# Patient Record
Sex: Male | Born: 1970 | Race: White | Hispanic: No | Smoking: Current every day smoker
Health system: Southern US, Community
[De-identification: ages and names within clinical notes are randomized; demographics above are authoritative.]

## PROBLEM LIST (undated history)

## (undated) DIAGNOSIS — G510 Bell's palsy: Secondary | ICD-10-CM

## (undated) DIAGNOSIS — N200 Calculus of kidney: Secondary | ICD-10-CM

## (undated) HISTORY — PX: ANKLE RECONSTRUCTION: SHX1151

---

## 2016-04-06 ENCOUNTER — Encounter (HOSPITAL_COMMUNITY): Payer: Self-pay | Admitting: Emergency Medicine

## 2016-04-06 ENCOUNTER — Emergency Department (HOSPITAL_COMMUNITY): Payer: Self-pay

## 2016-04-06 ENCOUNTER — Inpatient Hospital Stay (HOSPITAL_COMMUNITY)
Admission: EM | Admit: 2016-04-06 | Discharge: 2016-04-11 | DRG: 917 | Disposition: A | Discharge status: Home / Self Care | Payer: Self-pay | Attending: Internal Medicine | Admitting: Internal Medicine

## 2016-04-06 DIAGNOSIS — R4182 Altered mental status, unspecified: Secondary | ICD-10-CM

## 2016-04-06 DIAGNOSIS — R569 Unspecified convulsions: Secondary | ICD-10-CM | POA: Diagnosis present

## 2016-04-06 DIAGNOSIS — T50901A Poisoning by unspecified drugs, medicaments and biological substances, accidental (unintentional), initial encounter: Principal | ICD-10-CM | POA: Diagnosis present

## 2016-04-06 DIAGNOSIS — F17200 Nicotine dependence, unspecified, uncomplicated: Secondary | ICD-10-CM | POA: Diagnosis present

## 2016-04-06 DIAGNOSIS — B029 Zoster without complications: Secondary | ICD-10-CM | POA: Diagnosis present

## 2016-04-06 DIAGNOSIS — T68XXXA Hypothermia, initial encounter: Secondary | ICD-10-CM

## 2016-04-06 DIAGNOSIS — G9340 Encephalopathy, unspecified: Secondary | ICD-10-CM | POA: Diagnosis present

## 2016-04-06 DIAGNOSIS — R0681 Apnea, not elsewhere classified: Secondary | ICD-10-CM

## 2016-04-06 DIAGNOSIS — R739 Hyperglycemia, unspecified: Secondary | ICD-10-CM | POA: Diagnosis present

## 2016-04-06 DIAGNOSIS — Z87442 Personal history of urinary calculi: Secondary | ICD-10-CM

## 2016-04-06 DIAGNOSIS — N200 Calculus of kidney: Secondary | ICD-10-CM

## 2016-04-06 DIAGNOSIS — J9691 Respiratory failure, unspecified with hypoxia: Secondary | ICD-10-CM | POA: Diagnosis present

## 2016-04-06 DIAGNOSIS — N1330 Unspecified hydronephrosis: Secondary | ICD-10-CM | POA: Diagnosis present

## 2016-04-06 DIAGNOSIS — R319 Hematuria, unspecified: Secondary | ICD-10-CM

## 2016-04-06 DIAGNOSIS — E876 Hypokalemia: Secondary | ICD-10-CM | POA: Diagnosis present

## 2016-04-06 DIAGNOSIS — E872 Acidosis: Secondary | ICD-10-CM | POA: Diagnosis present

## 2016-04-06 DIAGNOSIS — I959 Hypotension, unspecified: Secondary | ICD-10-CM | POA: Diagnosis present

## 2016-04-06 DIAGNOSIS — R680 Hypothermia, not associated with low environmental temperature: Secondary | ICD-10-CM | POA: Diagnosis present

## 2016-04-06 DIAGNOSIS — N390 Urinary tract infection, site not specified: Secondary | ICD-10-CM

## 2016-04-06 DIAGNOSIS — F1010 Alcohol abuse, uncomplicated: Secondary | ICD-10-CM | POA: Diagnosis present

## 2016-04-06 DIAGNOSIS — A419 Sepsis, unspecified organism: Secondary | ICD-10-CM

## 2016-04-06 HISTORY — DX: Calculus of kidney: N20.0

## 2016-04-06 HISTORY — DX: Bell's palsy: G51.0

## 2016-04-06 LAB — CBC WITH DIFFERENTIAL/PLATELET
Basophils Absolute: 0 10*3/uL (ref 0.0–0.1)
Basophils Relative: 0 %
EOS ABS: 0.1 10*3/uL (ref 0.0–0.7)
Eosinophils Relative: 1 %
HEMATOCRIT: 41.8 % (ref 39.0–52.0)
HEMOGLOBIN: 14.3 g/dL (ref 13.0–17.0)
LYMPHS ABS: 1.8 10*3/uL (ref 0.7–4.0)
LYMPHS PCT: 20 %
MCH: 31.2 pg (ref 26.0–34.0)
MCHC: 34.2 g/dL (ref 30.0–36.0)
MCV: 91.3 fL (ref 78.0–100.0)
Monocytes Absolute: 0.8 10*3/uL (ref 0.1–1.0)
Monocytes Relative: 9 %
NEUTROS ABS: 6 10*3/uL (ref 1.7–7.7)
NEUTROS PCT: 70 %
Platelets: 193 10*3/uL (ref 150–400)
RBC: 4.58 MIL/uL (ref 4.22–5.81)
RDW: 14.3 % (ref 11.5–15.5)
WBC: 8.7 10*3/uL (ref 4.0–10.5)

## 2016-04-06 MED ORDER — SODIUM CHLORIDE 0.9 % IV SOLN
10.0000 ug/h | INTRAVENOUS | Status: DC
  Administered 2016-04-06: 10 ug/h via INTRAVENOUS

## 2016-04-06 MED ORDER — MIDAZOLAM 50MG/50ML (1MG/ML) PREMIX INFUSION
INTRAVENOUS | Status: AC
  Filled 2016-04-06: qty 50

## 2016-04-06 MED ORDER — FENTANYL 2500MCG IN NS 250ML (10MCG/ML) PREMIX INFUSION
INTRAVENOUS | Status: AC
  Filled 2016-04-06: qty 250

## 2016-04-06 MED ORDER — ETOMIDATE 2 MG/ML IV SOLN
20.0000 mg | Freq: Once | INTRAVENOUS | Status: AC
  Administered 2016-04-06: 20 mg via INTRAVENOUS

## 2016-04-06 MED ORDER — SODIUM CHLORIDE 0.9 % IV SOLN
1.0000 mg/h | INTRAVENOUS | Status: DC
  Administered 2016-04-06 – 2016-04-08 (×2): 1 mg/h via INTRAVENOUS

## 2016-04-06 MED ORDER — SUCCINYLCHOLINE CHLORIDE 20 MG/ML IJ SOLN
150.0000 mg | Freq: Once | INTRAMUSCULAR | Status: AC
  Administered 2016-04-06: 150 mg via INTRAVENOUS
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
## 2016-04-07 ENCOUNTER — Encounter (HOSPITAL_COMMUNITY): Payer: Self-pay | Admitting: Internal Medicine

## 2016-04-07 ENCOUNTER — Inpatient Hospital Stay (HOSPITAL_COMMUNITY): Payer: Self-pay

## 2016-04-07 DIAGNOSIS — R4182 Altered mental status, unspecified: Secondary | ICD-10-CM | POA: Diagnosis present

## 2016-04-07 DIAGNOSIS — E876 Hypokalemia: Secondary | ICD-10-CM | POA: Diagnosis present

## 2016-04-07 LAB — URINALYSIS, ROUTINE W REFLEX MICROSCOPIC
Glucose, UA: NEGATIVE mg/dL
Nitrite: NEGATIVE
PH: 6 (ref 5.0–8.0)
Protein, ur: NEGATIVE mg/dL
SPECIFIC GRAVITY, URINE: 1.01 (ref 1.005–1.030)

## 2016-04-07 LAB — BASIC METABOLIC PANEL
ANION GAP: 6 (ref 5–15)
BUN: 16 mg/dL (ref 6–20)
CHLORIDE: 112 mmol/L — AB (ref 101–111)
CO2: 20 mmol/L — AB (ref 22–32)
Calcium: 7.9 mg/dL — ABNORMAL LOW (ref 8.9–10.3)
Creatinine, Ser: 0.95 mg/dL (ref 0.61–1.24)
GFR calc non Af Amer: >60 mL/min (ref >60)
GLUCOSE: 96 mg/dL (ref 65–99)
Potassium: 3.1 mmol/L — ABNORMAL LOW (ref 3.5–5.1)
Sodium: 138 mmol/L (ref 135–145)

## 2016-04-07 LAB — COMPREHENSIVE METABOLIC PANEL
ALBUMIN: 4.5 g/dL (ref 3.5–5.0)
ALK PHOS: 63 U/L (ref 38–126)
ALT: 34 U/L (ref 17–63)
ANION GAP: 11 (ref 5–15)
AST: 63 U/L — ABNORMAL HIGH (ref 15–41)
BUN: 17 mg/dL (ref 6–20)
CHLORIDE: 106 mmol/L (ref 101–111)
CO2: 21 mmol/L — AB (ref 22–32)
Calcium: 9.2 mg/dL (ref 8.9–10.3)
Creatinine, Ser: 1.04 mg/dL (ref 0.61–1.24)
GFR calc non Af Amer: >60 mL/min (ref >60)
GLUCOSE: 122 mg/dL — AB (ref 65–99)
POTASSIUM: 3.2 mmol/L — AB (ref 3.5–5.1)
SODIUM: 138 mmol/L (ref 135–145)
Total Bilirubin: 1.2 mg/dL (ref 0.3–1.2)
Total Protein: 7.3 g/dL (ref 6.5–8.1)

## 2016-04-07 LAB — URINE MICROSCOPIC-ADD ON

## 2016-04-07 LAB — GLUCOSE, CAPILLARY
GLUCOSE-CAPILLARY: 104 mg/dL — AB (ref 65–99)
GLUCOSE-CAPILLARY: 100 mg/dL — AB (ref 65–99)
Glucose-Capillary: 94 mg/dL (ref 65–99)
Glucose-Capillary: 113 mg/dL — ABNORMAL HIGH (ref 65–99)

## 2016-04-07 LAB — CBC
HCT: 36.4 % — ABNORMAL LOW (ref 39.0–52.0)
HEMOGLOBIN: 12.2 g/dL — AB (ref 13.0–17.0)
MCH: 30.5 pg (ref 26.0–34.0)
MCHC: 33.5 g/dL (ref 30.0–36.0)
MCV: 91 fL (ref 78.0–100.0)
Platelets: 156 10*3/uL (ref 150–400)
RBC: 4 MIL/uL — AB (ref 4.22–5.81)
RDW: 14.5 % (ref 11.5–15.5)
WBC: 5.3 10*3/uL (ref 4.0–10.5)

## 2016-04-07 LAB — BLOOD GAS, ARTERIAL
ACID-BASE DEFICIT: 4.4 mmol/L — AB (ref 0.0–2.0)
Bicarbonate: 20.9 mmol/L (ref 20.0–28.0)
DRAWN BY: 21310
FIO2: 100
O2 SAT: 99.5 %
PATIENT TEMPERATURE: 37
PCO2 ART: 37.8 mmHg (ref 32.0–48.0)
PEEP/CPAP: 5 cmH2O
PH ART: 7.349 — AB (ref 7.350–7.450)
RATE: 16 resp/min
VT: 600 mL
pO2, Arterial: 414 mmHg — ABNORMAL HIGH (ref 83.0–108.0)

## 2016-04-07 LAB — LACTIC ACID, PLASMA
Lactic Acid, Venous: 0.8 mmol/L (ref 0.5–1.9)
Lactic Acid, Venous: 0.7 mmol/L (ref 0.5–1.9)
Lactic Acid, Venous: 1 mmol/L (ref 0.5–1.9)

## 2016-04-07 LAB — TROPONIN I

## 2016-04-07 LAB — PROCALCITONIN: Procalcitonin: <0.1 ng/mL

## 2016-04-07 LAB — MRSA PCR SCREENING: MRSA by PCR: NEGATIVE

## 2016-04-07 LAB — ETHANOL: Alcohol, Ethyl (B): 8 mg/dL — ABNORMAL HIGH (ref <5)

## 2016-04-07 LAB — ACETAMINOPHEN LEVEL

## 2016-04-07 LAB — VITAMIN B12: Vitamin B-12: 220 pg/mL (ref 180–914)

## 2016-04-07 LAB — PROTIME-INR
INR: 1.16
PROTHROMBIN TIME: 14.9 s (ref 11.4–15.2)

## 2016-04-07 LAB — RAPID URINE DRUG SCREEN, HOSP PERFORMED
AMPHETAMINES: POSITIVE — AB
BENZODIAZEPINES: NOT DETECTED
Barbiturates: NOT DETECTED
COCAINE: NOT DETECTED
OPIATES: NOT DETECTED
Tetrahydrocannabinol: NOT DETECTED

## 2016-04-07 LAB — APTT: APTT: 32 s (ref 24–36)

## 2016-04-07 LAB — CK: Total CK: 856 U/L — ABNORMAL HIGH (ref 49–397)

## 2016-04-07 LAB — SALICYLATE LEVEL

## 2016-04-07 MED ORDER — ADULT MULTIVITAMIN W/MINERALS CH
1.0000 | ORAL_TABLET | Freq: Every day | ORAL | Status: DC
  Administered 2016-04-08 – 2016-04-11 (×4): 1 via ORAL
  Filled 2016-04-07 (×4): qty 1

## 2016-04-07 MED ORDER — FOLIC ACID 1 MG PO TABS
1.0000 mg | ORAL_TABLET | Freq: Every day | ORAL | Status: DC
  Administered 2016-04-08 – 2016-04-11 (×4): 1 mg via ORAL
  Filled 2016-04-07 (×4): qty 1

## 2016-04-07 MED ORDER — CEFTRIAXONE SODIUM 1 G IJ SOLR
INTRAMUSCULAR | Status: AC
  Filled 2016-04-07: qty 10

## 2016-04-07 MED ORDER — INSULIN ASPART 100 UNIT/ML ~~LOC~~ SOLN: 0.0000 [IU] | SUBCUTANEOUS | Status: DC

## 2016-04-07 MED ORDER — SODIUM CHLORIDE 0.9% FLUSH
3.0000 mL | Freq: Two times a day (BID) | INTRAVENOUS | Status: DC
  Administered 2016-04-07 – 2016-04-11 (×7): 3 mL via INTRAVENOUS

## 2016-04-07 MED ORDER — DEXTROSE 5 % IV SOLN
10.0000 mg/kg | Freq: Three times a day (TID) | INTRAVENOUS | Status: DC
  Administered 2016-04-07 – 2016-04-11 (×13): 840 mg via INTRAVENOUS
  Filled 2016-04-07 (×17): qty 16.8

## 2016-04-07 MED ORDER — DEXTROSE 5 % IV SOLN
1.0000 g | Freq: Once | INTRAVENOUS | Status: AC
  Administered 2016-04-07: 1 g via INTRAVENOUS
  Filled 2016-04-07: qty 10

## 2016-04-07 MED ORDER — VANCOMYCIN HCL IN DEXTROSE 1-5 GM/200ML-% IV SOLN
1000.0000 mg | Freq: Once | INTRAVENOUS | Status: AC
  Administered 2016-04-07: 1000 mg via INTRAVENOUS
  Filled 2016-04-07: qty 200

## 2016-04-07 MED ORDER — POTASSIUM CHLORIDE 10 MEQ/100ML IV SOLN
10.0000 meq | INTRAVENOUS | Status: AC
  Administered 2016-04-07 (×4): 10 meq via INTRAVENOUS
  Filled 2016-04-07 (×4): qty 100

## 2016-04-07 MED ORDER — ACETAMINOPHEN 500 MG PO TABS: 1000.0000 mg | ORAL_TABLET | Freq: Once | ORAL | Status: DC

## 2016-04-07 MED ORDER — VITAMIN B-1 100 MG PO TABS
100.0000 mg | ORAL_TABLET | Freq: Every day | ORAL | Status: DC
  Administered 2016-04-09 – 2016-04-11 (×3): 100 mg via ORAL
  Filled 2016-04-07 (×3): qty 1

## 2016-04-07 MED ORDER — DEXTROSE 5 % IV SOLN
2.0000 g | Freq: Two times a day (BID) | INTRAVENOUS | Status: DC
  Administered 2016-04-07 – 2016-04-11 (×8): 2 g via INTRAVENOUS
  Filled 2016-04-07 (×10): qty 2

## 2016-04-07 MED ORDER — LACTATED RINGERS IV SOLN
INTRAVENOUS | Status: DC
  Administered 2016-04-07: 05:00:00 via INTRAVENOUS

## 2016-04-07 MED ORDER — THIAMINE HCL 100 MG/ML IJ SOLN
100.0000 mg | Freq: Every day | INTRAMUSCULAR | Status: DC
  Administered 2016-04-07 – 2016-04-08 (×2): 100 mg via INTRAVENOUS
  Filled 2016-04-07 (×2): qty 2

## 2016-04-07 MED ORDER — SODIUM CHLORIDE 0.9 % IV SOLN: 1000.0000 mg | Freq: Two times a day (BID) | INTRAVENOUS | Status: DC

## 2016-04-07 MED ORDER — DOCUSATE SODIUM 100 MG PO CAPS
100.0000 mg | ORAL_CAPSULE | Freq: Two times a day (BID) | ORAL | Status: DC
  Administered 2016-04-09 – 2016-04-11 (×5): 100 mg via ORAL
  Filled 2016-04-07 (×8): qty 1

## 2016-04-07 MED ORDER — LORAZEPAM 2 MG/ML IJ SOLN: 1.0000 mg | Freq: Four times a day (QID) | INTRAMUSCULAR | Status: AC | PRN

## 2016-04-07 MED ORDER — ORAL CARE MOUTH RINSE
15.0000 mL | Freq: Four times a day (QID) | OROMUCOSAL | Status: DC
  Administered 2016-04-07 – 2016-04-08 (×6): 15 mL via OROMUCOSAL

## 2016-04-07 MED ORDER — ACYCLOVIR SODIUM 50 MG/ML IV SOLN
INTRAVENOUS | Status: AC
  Filled 2016-04-07: qty 10

## 2016-04-07 MED ORDER — ACETAMINOPHEN 325 MG PO TABS
650.0000 mg | ORAL_TABLET | Freq: Four times a day (QID) | ORAL | Status: DC | PRN
Start: 2016-04-07 — End: 2016-04-11
  Administered 2016-04-10: 650 mg via ORAL
  Filled 2016-04-07: qty 2

## 2016-04-07 MED ORDER — ASPIRIN 300 MG RE SUPP
300.0000 mg | Freq: Every day | RECTAL | Status: DC
  Administered 2016-04-07 – 2016-04-08 (×2): 300 mg via RECTAL
  Filled 2016-04-07: qty 1

## 2016-04-07 MED ORDER — SODIUM CHLORIDE 0.9 % IV BOLUS (SEPSIS)
1000.0000 mL | Freq: Once | INTRAVENOUS | Status: AC
Start: 2016-04-07 — End: 2016-04-07
  Administered 2016-04-07: 1000 mL via INTRAVENOUS

## 2016-04-07 MED ORDER — LORAZEPAM 1 MG PO TABS: 1.0000 mg | ORAL_TABLET | Freq: Four times a day (QID) | ORAL | Status: AC | PRN

## 2016-04-07 MED ORDER — ONDANSETRON HCL 4 MG/2ML IJ SOLN: 4.0000 mg | Freq: Four times a day (QID) | INTRAMUSCULAR | Status: DC | PRN

## 2016-04-07 MED ORDER — IBUPROFEN 800 MG PO TABS: 800.0000 mg | ORAL_TABLET | Freq: Once | ORAL | Status: DC

## 2016-04-07 MED ORDER — VANCOMYCIN HCL IN DEXTROSE 1-5 GM/200ML-% IV SOLN
1000.0000 mg | Freq: Three times a day (TID) | INTRAVENOUS | Status: DC
  Administered 2016-04-07 – 2016-04-09 (×6): 1000 mg via INTRAVENOUS
  Filled 2016-04-07 (×5): qty 200

## 2016-04-07 MED ORDER — ENOXAPARIN SODIUM 40 MG/0.4ML ~~LOC~~ SOLN
40.0000 mg | SUBCUTANEOUS | Status: DC
  Administered 2016-04-07: 40 mg via SUBCUTANEOUS
  Filled 2016-04-07: qty 0.4

## 2016-04-07 MED ORDER — CHLORHEXIDINE GLUCONATE 0.12% ORAL RINSE (MEDLINE KIT)
15.0000 mL | Freq: Two times a day (BID) | OROMUCOSAL | Status: DC
  Administered 2016-04-07 – 2016-04-08 (×3): 15 mL via OROMUCOSAL

## 2016-04-07 MED ORDER — ONDANSETRON HCL 4 MG PO TABS: 4.0000 mg | ORAL_TABLET | Freq: Four times a day (QID) | ORAL | Status: DC | PRN

## 2016-04-07 MED ORDER — ACETAMINOPHEN 650 MG RE SUPP
650.0000 mg | Freq: Four times a day (QID) | RECTAL | Status: DC | PRN
Start: 2016-04-07 — End: 2016-04-11

## 2016-04-07 MED ORDER — LEVETIRACETAM IN NACL 1000 MG/100ML IV SOLN
1000.0000 mg | Freq: Two times a day (BID) | INTRAVENOUS | Status: DC
  Administered 2016-04-07 – 2016-04-08 (×4): 1000 mg via INTRAVENOUS
  Filled 2016-04-07 (×9): qty 100

## 2016-04-07 MED ORDER — DEXTROSE 5 % IV SOLN
1.0000 g | INTRAVENOUS | Status: AC
  Administered 2016-04-07: 1 g via INTRAVENOUS

## 2016-04-07 MED ORDER — SODIUM CHLORIDE 0.9 % IV SOLN
Freq: Once | INTRAVENOUS | Status: AC
  Administered 2016-04-07: 01:00:00 via INTRAVENOUS

## 2016-04-07 MED ORDER — KCL IN DEXTROSE-NACL 20-5-0.9 MEQ/L-%-% IV SOLN
INTRAVENOUS | Status: DC
  Administered 2016-04-07 – 2016-04-08 (×3): via INTRAVENOUS

## 2016-04-07 MED ORDER — FAMOTIDINE IN NACL 20-0.9 MG/50ML-% IV SOLN
20.0000 mg | Freq: Two times a day (BID) | INTRAVENOUS | Status: DC
  Administered 2016-04-07 – 2016-04-08 (×3): 20 mg via INTRAVENOUS
  Filled 2016-04-07 (×3): qty 50

## 2016-04-07 MED ORDER — SODIUM CHLORIDE 0.9 % IV BOLUS (SEPSIS)
1000.0000 mL | Freq: Once | INTRAVENOUS | Status: AC
  Administered 2016-04-07: 1000 mL via INTRAVENOUS

## 2016-04-07 MED ORDER — SODIUM CHLORIDE 0.9 % IV BOLUS (SEPSIS): 1000.0000 mL | Freq: Once | INTRAVENOUS | Status: DC

## 2016-04-07 NOTE — Consult Note (Signed)
Consult requested by:Dr. Conley RollsLe Consult requested for:  ZOX:WRUEHPI:this is a 45 yo who came to the ED with altered mental status. He has a history of Bell's palsy. He was out of town had been complaining of a upper respiratory infection and then noticed that his mouth was drooping on the right. He called his girlfriend to pick him up and it was thought that his Bell's palsy might be recurring. She brought him home went to the store to get vitamins but then when they came home from that she could not get into the house and found the patient lying in the living room floor. There was some question about trying to take some sort of medication he had apparently given a history in the emergency department of perhaps having shingles. He started having seizures in the emergency department and he was intubated for airway protection. He has a history of kidney stones as well. All of the history is obtained from the medical record and nursing staff because he is unable to provide any history and there is no family present.  Past Medical History:  Diagnosis Date  . Bell palsy   . Kidney stones      History reviewed. No pertinent family history.   Social History   Social History  . Marital status: Significant Other    Spouse name: N/A  . Number of children: N/A  . Years of education: N/A   Occupational History  . painter    Social History Main Topics  . Smoking status: Current Every Day Smoker    Packs/day: 1.00    Years: 30.00  . Smokeless tobacco: Never Used  . Alcohol use No  . Drug use:     Types: Marijuana, Methamphetamines     Comment: unsure of how recently  . Sexual activity: Not Asked   Other Topics Concern  . None   Social History Narrative  . None     ROS: His review of systems is unobtainable    Objective: Vital signs in last 24 hours: Temp:  [94.6 F (34.8 C)-96.8 F (36 C)] 94.6 F (34.8 C) (10/26 0754) Pulse Rate:  [64-106] 76 (10/26 0330) Resp:  [16-17] 16 (10/26  0415) BP: (82-170)/(58-103) 108/67 (10/26 0415) SpO2:  [100 %] 100 % (10/26 0415) FiO2 (%):  [50 %-100 %] 50 % (10/26 0600) Weight:  [83.9 kg (184 lb 15.5 oz)] 83.9 kg (184 lb 15.5 oz) (10/26 0415) Weight change:     Intake/Output from previous day: 10/25 0701 - 10/26 0700 In: 516.8 [IV Piggyback:516.8] Out: 1980 [Urine:1980]  PHYSICAL EXAM He is intubated and sedated. His face looks symmetrical but of course he has an endotracheal tube in place. Pupils do react. Mucous membranes are dry. His neck is supple without masses. His chest is clear with minimal rhonchi. His heart is regular without gallop. Abdomen is soft no masses are felt bowel sounds are present and active. Extremities show no trauma. No edema. He was able to move all 4 extremities on admission but can't tell much else about his central nervous system examination at this point.  Lab Results: Basic Metabolic Panel:  Recent Labs  45/40/9810/25/17 2329 04/07/16 0441  NA 138 138  K 3.2* 3.1*  CL 106 112*  CO2 21* 20*  GLUCOSE 122* 96  BUN 17 16  CREATININE 1.04 0.95  CALCIUM 9.2 7.9*   Liver Function Tests:  Recent Labs  04/06/16 2329  AST 63*  ALT 34  ALKPHOS 63  BILITOT 1.2  PROT 7.3  ALBUMIN 4.5   No results for input(s): LIPASE, AMYLASE in the last 72 hours. No results for input(s): AMMONIA in the last 72 hours. CBC:  Recent Labs  04/06/16 2329 04/07/16 0441  WBC 8.7 5.3  NEUTROABS 6.0  --   HGB 14.3 12.2*  HCT 41.8 36.4*  MCV 91.3 91.0  PLT 193 156   Cardiac Enzymes:  Recent Labs  04/06/16 2329  TROPONINI <0.03   BNP: No results for input(s): PROBNP in the last 72 hours. D-Dimer: No results for input(s): DDIMER in the last 72 hours. CBG: No results for input(s): GLUCAP in the last 72 hours. Hemoglobin A1C: No results for input(s): HGBA1C in the last 72 hours. Fasting Lipid Panel: No results for input(s): CHOL, HDL, LDLCALC, TRIG, CHOLHDL, LDLDIRECT in the last 72 hours. Thyroid  Function Tests: No results for input(s): TSH, T4TOTAL, FREET4, T3FREE, THYROIDAB in the last 72 hours. Anemia Panel: No results for input(s): VITAMINB12, FOLATE, FERRITIN, TIBC, IRON, RETICCTPCT in the last 72 hours. Coagulation:  Recent Labs  04/07/16 0441  LABPROT 14.9  INR 1.16   Urine Drug Screen: Drugs of Abuse     Component Value Date/Time   LABOPIA NONE DETECTED 04/06/2016 2350   COCAINSCRNUR NONE DETECTED 04/06/2016 2350   LABBENZ NONE DETECTED 04/06/2016 2350   AMPHETMU POSITIVE (A) 04/06/2016 2350   THCU NONE DETECTED 04/06/2016 2350   LABBARB NONE DETECTED 04/06/2016 2350    Alcohol Level:  Recent Labs  04/06/16 2329  ETH 8*   Urinalysis:  Recent Labs  04/06/16 2350  COLORURINE YELLOW  LABSPEC 1.010  PHURINE 6.0  GLUCOSEU NEGATIVE  HGBUR LARGE*  BILIRUBINUR LARGE*  KETONESUR TRACE*  PROTEINUR NEGATIVE  NITRITE NEGATIVE  LEUKOCYTESUR SMALL*   Misc. Labs:   ABGS:  Recent Labs  04/07/16 0019  PHART 7.349*  PO2ART 414.0*  HCO3 20.9     MICROBIOLOGY: Recent Results (from the past 240 hour(s))  Culture, blood (routine x 2)     Status: None (Preliminary result)   Collection Time: 04/07/16 12:56 AM  Result Value Ref Range Status   Specimen Description BLOOD LEFT ANTECUBITAL  Final   Special Requests   Final    BOTTLES DRAWN AEROBIC AND ANAEROBIC AEB 10CC ANA 5CC   Culture NO GROWTH < 12 HOURS  Final   Report Status PENDING  Incomplete  Culture, blood (routine x 2)     Status: None (Preliminary result)   Collection Time: 04/07/16  1:06 AM  Result Value Ref Range Status   Specimen Description BLOOD LEFT HAND  Final   Special Requests BOTTLES DRAWN AEROBIC AND ANAEROBIC 8CC EACH  Final   Culture NO GROWTH < 12 HOURS  Final   Report Status PENDING  Incomplete  MRSA PCR Screening     Status: None   Collection Time: 04/07/16  4:30 AM  Result Value Ref Range Status   MRSA by PCR NEGATIVE NEGATIVE Final    Comment:        The GeneXpert MRSA  Assay (FDA approved for NASAL specimens only), is one component of a comprehensive MRSA colonization surveillance program. It is not intended to diagnose MRSA infection nor to guide or monitor treatment for MRSA infections.     Studies/Results: Ct Head Wo Contrast  Result Date: 04/07/2016 CLINICAL DATA:  Altered mental status, intubation. Possible drug overdose. EXAM: CT HEAD WITHOUT CONTRAST TECHNIQUE: Contiguous axial images were obtained from the base of the skull through the vertex without intravenous contrast. COMPARISON:  None. FINDINGS: BRAIN: The ventricles and sulci are normal. No intraparenchymal hemorrhage, mass effect nor midline shift. No acute large vascular territory infarcts. No abnormal extra-axial fluid collections. Basal cisterns are patent. VASCULAR: Unremarkable. SKULL/SOFT TISSUES: No skull fracture. No significant soft tissue swelling. ORBITS/SINUSES: The included ocular globes and orbital contents are normal.Mild paranasal sinus mucosal thickening. Mastoid air cells are well aerated. OTHER: Fullness of the nasopharyngeal soft tissues, attributable to intubation. IMPRESSION: Normal CT HEAD. Electronically Signed   By: Awilda Metro M.D.   On: 04/07/2016 00:38   Dg Chest Port 1 View  Result Date: 04/06/2016 CLINICAL DATA:  45 year old male with altered mental status. Status post intubation. EXAM: PORTABLE CHEST 1 VIEW COMPARISON:  None. FINDINGS: An endotracheal tube is noted with tip approximately 3.6 cm above the carina. An enteric tube courses into the left upper abdomen with tip under the left hemidiaphragm, likely in the gastric fundus. Mild diffuse interstitial coarsening. No focal consolidation, pleural effusion, or pneumothorax. Top-normal cardiac size. No acute osseous pathology. IMPRESSION: Endotracheal tube above the carina and enteric tube with tip in the left upper abdomen likely in the stomach. Electronically Signed   By: Elgie Collard M.D.   On:  04/06/2016 23:57    Medications:  Prior to Admission:  No prescriptions prior to admission.   Scheduled: . acyclovir  10 mg/kg Intravenous Q8H  . aspirin  300 mg Rectal Daily  . cefTRIAXone (ROCEPHIN)  IV  2 g Intravenous Q12H  . docusate sodium  100 mg Oral BID  . famotidine (PEPCID) IV  20 mg Intravenous Q12H  . folic acid  1 mg Oral Daily  . multivitamin with minerals  1 tablet Oral Daily  . sodium chloride  1,000 mL Intravenous Once  . sodium chloride flush  3 mL Intravenous Q12H  . thiamine  100 mg Oral Daily   Or  . thiamine  100 mg Intravenous Daily  . vancomycin  1,000 mg Intravenous Q8H   Continuous: . dextrose 5 % and 0.9 % NaCl with KCl 20 mEq/L    . fentaNYL infusion INTRAVENOUS 10 mcg/hr (04/07/16 0646)  . lactated ringers 125 mL/hr at 04/07/16 0451  . midazolam (VERSED) infusion 1 mg/hr (04/07/16 0600)   WUJ:[id number] **OR** acetaminophen, LORazepam **OR** LORazepam, ondansetron **OR** ondansetron (ZOFRAN) IV  Assesment: He has acute respiratory failure and is intubated for airway protection. He has had altered mental status and history related to that is confusing. He has had seizures. His potassium is low. Urinalysis is suggestive that he has urinary tract infection. His urine drug screen was positive for amphetamines and his alcohol level was slightly elevated.  Differential diagnosis is very wide. He was positive for amphetamines so that may be the etiology of the problem. This could include herpetic encephalopathy with seizures. He may have some other form of encephalitis/meningitis. He may be having alcohol withdrawal seizures although he had some alcohol in his system that was a low level that could be consistent with dropping alcohol level and perhaps DTs. CT was negative for any sort of acute changes in his brain. Other than his low potassium and mildly elevated AST and glucose his blood testing does not show a definite cause of his problem. Lumbar puncture  was attempted but not able to be done. Principal Problem:   Altered mental status Active Problems:   Hypokalemia    Plan: Continue ventilator support. As discussed with Dr. Conley Rolls see if we can get a lumbar puncture done and neurology consult  if available. Agree with starting acyclovir and continuing with broad-spectrum antibiotics until we have more information . Potentially send off for other types of encephalitis like equine encephalitis. With skin lesions I would go ahead and check for tickborne illness as well. No plans for weaning/extubation right now    LOS: 0 days   Kennidi Yoshida L 04/07/2016, 8:08 AM

## 2016-04-07 NOTE — ED Notes (Signed)
Patient transported to CT 

## 2016-04-07 NOTE — Progress Notes (Signed)
PROGRESS NOTE    Alejandro Conner  XBJ:[id number] DOB: 1971-03-15 DOA: 04/06/2016 PCP: No PCP Per Patient   Brief Narrative:  [age] yom with a PMHx of bell's palsy in which he ran out of his medication,  presented with unresponsiveness. Per girlfriend he recently went out of town in which he noticed that his "right eye was drooling and his mouth was drooping" and he was unable to get up from the floor. While in the ED, he was noted to be having seizure activity, in which he was intubated. He was also noted to be hypothermic and hypertensive. His serology was unremarkable. Urinalysis was indicative of infection and his urine screen was positive for amphetamines. He was started on narcan and aspirin and admitted for further evaluation of acute encephalopathy.   Assessment & Plan:   Principal Problem:   Altered mental status Active Problems:   Hypokalemia  1. Acute encephalopathy. DDx is broad, including infection, seizure, GBS vs other neurological condition, HSV, sepsis.  Will continue with IV Van/Rocephin/Acyclovir.  Will need to get LP if possible, and request radiology's help.  I will consult neurology and appreciate PCCM help.  Will continue with current Tx.  Obtain EEG, MRI, LP, B12, HIV, Thiamine, and cultures.   2. Possible meningitis. Dr. Lynelle DoctorKnapp attempted a lumbar puncture but was unsuccessful. Will continue Vancomycin and Ceftriaxone.  3. UTI. UA indicative of possible infection. Will follow cultures. Continue on empiric antibiotics Rocephin and vancomycin.  4. Respiratory failure with hypoxia. Patient was hypoxic on arrival. He was intubated in the emergency department, will wean as tolerated.  5. Possible seizure activity. While in the ER he was noted to be having a seizure. Continue to hold off on Keppra until seizure activity takes place.  6. Polysubstance abuse. Urine screen was positive for amphetamines. His blood screen also showed alcohol abuse. Will give thiamine, folate, and expect  withdrawal.   DVT prophylaxis: SCD.  Code Status: FULL  Family Communication: None at bedside this morning.  Disposition Plan: Discharge home once improved.    Consultants:   PCCM  Neurology.   Procedures:   Intubated 10/26 >>  Antimicrobials:   Rocephin 10/26 >>  Vancomycin 10/26 >>   Acyclovir. 10/26.   Subjective: Intubated and sedated.    Objective: Vitals:   04/07/16 0330 04/07/16 0340 04/07/16 0400 04/07/16 0415  BP: (!) 82/62 103/73  108/67  Pulse: 76     Resp: 16 16  16   Temp:      TempSrc:      SpO2: 100%  100% 100%  Weight:    83.9 kg (184 lb 15.5 oz)  Height:    6\' 2"  (1.88 m)    Intake/Output Summary (Last 24 hours) at 04/07/16 0611 Last data filed at 04/07/16 0043  Gross per 24 hour  Intake                0 ml  Output             1500 ml  Net            -1500 ml   Filed Weights   04/07/16 0415  Weight: 83.9 kg (184 lb 15.5 oz)    Examination:  General exam: Appears calm and comfortable  Respiratory system: Clear to auscultation. Respiratory effort normal. Cardiovascular system: S1 & S2 heard, RRR. No JVD, murmurs, rubs, gallops or clicks. No pedal edema. Gastrointestinal system: Abdomen is nondistended, soft and nontender. No organomegaly or masses felt. Normal bowel sounds  heard. Central nervous system: Alert and oriented. No focal neurological deficits. Extremities: Symmetric 5 x 5 power. Skin: some rash on suprapubic area. Psychiatry: Judgement and insight appear normal. Mood & affect appropriate.     Data Reviewed:   CBC:  Recent Labs Lab 04/06/16 2329 04/07/16 0441  WBC 8.7 5.3  NEUTROABS 6.0  --   HGB 14.3 12.2*  HCT 41.8 36.4*  MCV 91.3 91.0  PLT 193 156   Basic Metabolic Panel:  Recent Labs Lab 04/06/16 2329 04/07/16 0441  NA 138 138  K 3.2* 3.1*  CL 106 112*  CO2 21* 20*  GLUCOSE 122* 96  BUN 17 16  CREATININE 1.04 0.95  CALCIUM 9.2 7.9*   GFR: Estimated Creatinine Clearance: 111.8 mL/min (by C-G  formula based on SCr of 0.95 mg/dL). Liver Function Tests:  Recent Labs Lab 04/06/16 2329  AST 63*  ALT 34  ALKPHOS 63  BILITOT 1.2  PROT 7.3  ALBUMIN 4.5   Coagulation Profile:  Recent Labs Lab 04/07/16 0441  INR 1.16   Cardiac Enzymes:  Recent Labs Lab 04/06/16 2329  TROPONINI <0.03    Recent Labs Lab 04/07/16 0057 04/07/16 0441  PROCALCITON  --  <0.10  LATICACIDVEN 0.8 0.7    Recent Results (from the past 240 hour(s))  Culture, blood (routine x 2)     Status: None (Preliminary result)   Collection Time: 04/07/16 12:56 AM  Result Value Ref Range Status   Specimen Description BLOOD LEFT ANTECUBITAL  Final   Special Requests   Final    BOTTLES DRAWN AEROBIC AND ANAEROBIC AEB 10CC ANA 5CC   Culture PENDING  Incomplete   Report Status PENDING  Incomplete  Culture, blood (routine x 2)     Status: None (Preliminary result)   Collection Time: 04/07/16  1:06 AM  Result Value Ref Range Status   Specimen Description BLOOD LEFT HAND  Final   Special Requests BOTTLES DRAWN AEROBIC AND ANAEROBIC [facility] EACH  Final   Culture PENDING  Incomplete   Report Status PENDING  Incomplete         Radiology Studies: Ct Head Wo Contrast  Result Date: 04/07/2016 CLINICAL DATA:  Altered mental status, intubation. Possible drug overdose. EXAM: CT HEAD WITHOUT CONTRAST TECHNIQUE: Contiguous axial images were obtained from the base of the skull through the vertex without intravenous contrast. COMPARISON:  None. FINDINGS: BRAIN: The ventricles and sulci are normal. No intraparenchymal hemorrhage, mass effect nor midline shift. No acute large vascular territory infarcts. No abnormal extra-axial fluid collections. Basal cisterns are patent. VASCULAR: Unremarkable. SKULL/SOFT TISSUES: No skull fracture. No significant soft tissue swelling. ORBITS/SINUSES: The included ocular globes and orbital contents are normal.Mild paranasal sinus mucosal thickening. Mastoid air cells are well aerated.  OTHER: Fullness of the nasopharyngeal soft tissues, attributable to intubation. IMPRESSION: Normal CT HEAD. Electronically Signed   By: Awilda Metro M.D.   On: 04/07/2016 00:38   Dg Chest Port 1 View  Result Date: 04/06/2016 CLINICAL DATA:  45 year old male with altered mental status. Status post intubation. EXAM: PORTABLE CHEST 1 VIEW COMPARISON:  None. FINDINGS: An endotracheal tube is noted with tip approximately 3.6 cm above the carina. An enteric tube courses into the left upper abdomen with tip under the left hemidiaphragm, likely in the gastric fundus. Mild diffuse interstitial coarsening. No focal consolidation, pleural effusion, or pneumothorax. Top-normal cardiac size. No acute osseous pathology. IMPRESSION: Endotracheal tube above the carina and enteric tube with tip in the left upper abdomen likely in  the stomach. Electronically Signed   By: Elgie Collard M.D.   On: 04/06/2016 23:57        Scheduled Meds: . acyclovir  10 mg/kg Intravenous Q8H  . aspirin  300 mg Rectal Daily  . cefTRIAXone (ROCEPHIN)  IV  1 g Intravenous NOW  . docusate sodium  100 mg Oral BID  . enoxaparin (LOVENOX) injection  40 mg Subcutaneous Q24H  . famotidine (PEPCID) IV  20 mg Intravenous Q12H  . potassium chloride  10 mEq Intravenous Q1 Hr x 4  . sodium chloride  1,000 mL Intravenous Once  . sodium chloride flush  3 mL Intravenous Q12H   Continuous Infusions: . fentaNYL infusion INTRAVENOUS 20 mcg/hr (04/07/16 0243)  . lactated ringers 125 mL/hr at 04/07/16 0451  . midazolam (VERSED) infusion 1 mg/hr (04/06/16 2342)     LOS: 0 days    Time spent: 25 minutes     Houston Siren, MD FACP [facility] If 7PM-7AM, please contact night-coverage [email or web address] Password [email or web address] 04/07/2016, 6:11 AM   By signing my name below, I, Cynda Acres, attest that this documentation has been prepared under the direction and in the presence of Houston Siren, MD. Electronically signed: Cynda Acres, Scribe.  04/07/16

## 2016-04-07 NOTE — ED Notes (Signed)
Patient's significant other  [phone]

## 2016-04-07 NOTE — Progress Notes (Signed)
ANTIBIOTIC CONSULT NOTE- follow up  Pharmacy Consult for vancomycin, acyclovir, ceftriaxone Indication: meningitis  Allergies  Allergen Reactions  . Morphine And Related     Per significant other   Patient Measurements: Height: 6\' 2"  (188 cm) Weight: 184 lb 15.5 oz (83.9 kg) IBW/kg (Calculated) : 82.2  Vital Signs: Temp: 96.8 F (36 C) (10/26 0415) Temp Source: Axillary (10/26 0415) BP: 108/67 (10/26 0415) Pulse Rate: 76 (10/26 0330)  Labs:  Recent Labs  04/06/16 2329 04/07/16 0441  WBC 8.7 5.3  HGB 14.3 12.2*  PLT 193 156  CREATININE 1.04 0.95   Estimated Creatinine Clearance: 111.8 mL/min (by C-G formula based on SCr of 0.95 mg/dL).  No results for input(s): VANCOTROUGH, VANCOPEAK, VANCORANDOM, GENTTROUGH, GENTPEAK, GENTRANDOM, TOBRATROUGH, TOBRAPEAK, TOBRARND, AMIKACINPEAK, AMIKACINTROU, AMIKACIN in the last 72 hours.   Microbiology: Recent Results (from the past 720 hour(s))  Culture, blood (routine x 2)     Status: None (Preliminary result)   Collection Time: 04/07/16 12:56 AM  Result Value Ref Range Status   Specimen Description BLOOD LEFT ANTECUBITAL  Final   Special Requests   Final    BOTTLES DRAWN AEROBIC AND ANAEROBIC AEB 10CC ANA 5CC   Culture PENDING  Incomplete   Report Status PENDING  Incomplete  Culture, blood (routine x 2)     Status: None (Preliminary result)   Collection Time: 04/07/16  1:06 AM  Result Value Ref Range Status   Specimen Description BLOOD LEFT HAND  Final   Special Requests BOTTLES DRAWN AEROBIC AND ANAEROBIC [facility]8CC EACH  Final   Culture PENDING  Incomplete   Report Status PENDING  Incomplete   Medical History: Past Medical History:  Diagnosis Date  . Bell palsy   . Kidney stones    Medications:  No prescriptions prior to admission.   Scheduled:  . acyclovir  10 mg/kg Intravenous Q8H  . aspirin  300 mg Rectal Daily  . cefTRIAXone (ROCEPHIN)  IV  2 g Intravenous Q12H  . docusate sodium  100 mg Oral BID  . enoxaparin  (LOVENOX) injection  40 mg Subcutaneous Q24H  . famotidine (PEPCID) IV  20 mg Intravenous Q12H  . potassium chloride  10 mEq Intravenous Q1 Hr x 4  . sodium chloride  1,000 mL Intravenous Once  . sodium chloride flush  3 mL Intravenous Q12H  . vancomycin  1,000 mg Intravenous Q8H   Infusions:  . fentaNYL infusion INTRAVENOUS 10 mcg/hr (04/07/16 0646)  . lactated ringers 125 mL/hr at 04/07/16 0451  . midazolam (VERSED) infusion 1 mg/hr (04/07/16 0600)   PRN: acetaminophen **OR** acetaminophen, ondansetron **OR** ondansetron (ZOFRAN) IV Anti-infectives    Start     Dose/Rate Route Frequency Ordered Stop   04/07/16 1800  cefTRIAXone (ROCEPHIN) 2 g in dextrose 5 % 50 mL IVPB     2 g 100 mL/hr over 30 Minutes Intravenous Every 12 hours 04/07/16 0734     04/07/16 1300  vancomycin (VANCOCIN) IVPB 1000 mg/200 mL premix     1,000 mg 200 mL/hr over 60 Minutes Intravenous Every 8 hours 04/07/16 0735     04/07/16 0430  acyclovir (ZOVIRAX) 840 mg in dextrose 5 % 150 mL IVPB     10 mg/kg  83.9 kg 166.8 mL/hr over 60 Minutes Intravenous Every 8 hours 04/07/16 0407     04/07/16 0430  vancomycin (VANCOCIN) IVPB 1000 mg/200 mL premix     1,000 mg 200 mL/hr over 60 Minutes Intravenous  Once 04/07/16 0428 04/07/16 0551   04/07/16  0415  cefTRIAXone (ROCEPHIN) 1 g in dextrose 5 % 50 mL IVPB     1 g 100 mL/hr over 30 Minutes Intravenous NOW 04/07/16 0402 04/07/16 0625   04/07/16 0045  cefTRIAXone (ROCEPHIN) 1 g in dextrose 5 % 50 mL IVPB     1 g 100 mL/hr over 30 Minutes Intravenous  Once 04/07/16 0044 04/07/16 0143     Assessment: 45 yo male with hx nephrolithiasis and Bell's palsy presented unresponsive.  Pt slurring speech and drooling. Unable to get LP tonight.  Starting vanc, acyclovir, and ceftriaxone for meningitis.   Goal of Therapy:  Vancomycin trough level 15-20 mcg/ml  Plan:  Acyclovir 10mg /Kg IV q8hrs Vancomycin 1000mg  IV q8hrs Rocephin 2gm IV q12hrs Monitor labs, progress, renal  fxn, c/s  Valrie Hart, PharmD Clinical Pharmacist Pager:  [phone] 04/07/2016   04/07/2016,7:36 AM

## 2016-04-07 NOTE — Progress Notes (Signed)
Pt transported and back to CT with no problem on vent respiratory assisted

## 2016-04-07 NOTE — ED Notes (Signed)
Hospitalist at bedside 

## 2016-04-07 NOTE — Progress Notes (Signed)
ADDENDUM:   Spoke with Alric SetonLina, his fiancee, lives with him x 3 years.  His father is out of town, and his mother is at work.  He has a step brother. He was not feeling well, for a couple of days, then she found him unconscious on the floor and called EMS.  He had hx of substance abuse before, mainly Narcotics, and had been on Opoid.  She said he doesn't drink alcohol.   Houston SirenPeter Kenard Morawski MD [facility] CenterFACP Hospitalist.

## 2016-04-07 NOTE — Progress Notes (Signed)
ANTIBIOTIC CONSULT NOTE-Preliminary  Pharmacy Consult for vancomycin, acyclovir, ceftriaxone Indication: meningitis  Allergies  Allergen Reactions  . Morphine And Related     Per significant other    Patient Measurements: Height: 6\' 2"  (188 cm) Weight: 184 lb 15.5 oz (83.9 kg) IBW/kg (Calculated) : 82.2   Vital Signs: Temp: 95.2 F (35.1 C) (10/26 0242) Temp Source: Rectal (10/26 0242) BP: 108/67 (10/26 0415) Pulse Rate: 76 (10/26 0330)  Labs:  Recent Labs  04/06/16 2329  WBC 8.7  HGB 14.3  PLT 193  CREATININE 1.04    Estimated Creatinine Clearance: 102.1 mL/min (by C-G formula based on SCr of 1.04 mg/dL).  No results for input(s): VANCOTROUGH, VANCOPEAK, VANCORANDOM, GENTTROUGH, GENTPEAK, GENTRANDOM, TOBRATROUGH, TOBRAPEAK, TOBRARND, AMIKACINPEAK, AMIKACINTROU, AMIKACIN in the last 72 hours.   Microbiology: No results found for this or any previous visit (from the past 720 hour(s)).  Medical History: Past Medical History:  Diagnosis Date  . Bell palsy   . Kidney stones     Medications:  No prescriptions prior to admission.   Scheduled:  . acyclovir  10 mg/kg Intravenous Q8H  . aspirin  300 mg Rectal Daily  . cefTRIAXone (ROCEPHIN)  IV  1 g Intravenous NOW  . docusate sodium  100 mg Oral BID  . enoxaparin (LOVENOX) injection  40 mg Subcutaneous Q24H  . famotidine (PEPCID) IV  20 mg Intravenous Q12H  . potassium chloride  10 mEq Intravenous Q1 Hr x 4  . sodium chloride  1,000 mL Intravenous Once   And  . sodium chloride  1,000 mL Intravenous Once  . sodium chloride flush  3 mL Intravenous Q12H  . vancomycin  1,000 mg Intravenous Once   Infusions:  . fentaNYL infusion INTRAVENOUS 20 mcg/hr (04/07/16 0243)  . lactated ringers    . midazolam (VERSED) infusion 1 mg/hr (04/06/16 2342)   PRN: acetaminophen **OR** acetaminophen, ondansetron **OR** ondansetron (ZOFRAN) IV Anti-infectives    Start     Dose/Rate Route Frequency Ordered Stop   04/07/16  0430  acyclovir (ZOVIRAX) 840 mg in dextrose 5 % 150 mL IVPB     10 mg/kg  83.9 kg 166.8 mL/hr over 60 Minutes Intravenous Every 8 hours 04/07/16 0407     04/07/16 0430  vancomycin (VANCOCIN) IVPB 1000 mg/200 mL premix     1,000 mg 200 mL/hr over 60 Minutes Intravenous  Once 04/07/16 0428     04/07/16 0415  cefTRIAXone (ROCEPHIN) 1 g in dextrose 5 % 50 mL IVPB     1 g 100 mL/hr over 30 Minutes Intravenous NOW 04/07/16 0402 04/08/16 0415   04/07/16 0045  cefTRIAXone (ROCEPHIN) 1 g in dextrose 5 % 50 mL IVPB     1 g 100 mL/hr over 30 Minutes Intravenous  Once 04/07/16 0044 04/07/16 0143      Assessment: 45 yo male with hx nephrolithiasis and Bell's palsy presented unresponsive.  Pt slurring speech and drooling. Unable to get LP tonight.  Starting vanc, acyclovir, and ceftriaxone for meningitis.   Goal of Therapy:  Vancomycin trough level 15-20 mcg/ml  Plan:  Preliminary review of pertinent patient information completed.  Protocol will be initiated with a one-time dose(s) of vancomycin 1 gram IV, acyclovir 10mg /kg Q8 hours, ceftriaxone 1 gram now in addition to the 1 gram given in ED.  Jeani HawkingAnnie Penn clinical pharmacist will complete review during morning rounds to assess patient and finalize treatment regimen.  Tome Wilson Scarlett, RPH 04/07/2016,4:41 AM

## 2016-04-07 NOTE — Consult Note (Addendum)
Cave A. Merlene Laughter, MD     [email or web address]          Alejandro Conner is an 45 y.o. male.   ASSESSMENT/PLAN: 1. Unexplained encephalopathy. The patient was found down unresponsive which typically points to the significant intracranial at the mouth is possibly acute stroke, unwitnessed seizures or syncope/cardiopulmonary arrest. The patient is not febrile but appears to have evidence of acute UTI. He is being treated with broad-spectrum antibiotics which is appropriate. A LP is being planned. I will also add Keppra as there are reports of the patient having a seizure. The EEG will also be obtained.    The patient is a 45 year old who has had some upper respiratory symptoms recently. The patient was noted to have focal left facial weakness. He does have a past history of Bell's palsy and the the patient may believe he was having a recurrent episode. He was taking vitamins for his acute symptoms. He was found unresponsive at home. He was noted to have a generalized shaking activity after he was found unresponsive. He has been intubated and placed on broad-spectrum antibiotics. The workup is significant for UTI. He has not been febrile.    GENERAL: Intubated and sedated.  HEENT: Supple neck.  ABDOMEN: soft  EXTREMITIES: No edema   BACK: Normal  SKIN: Normal by inspection.    MENTAL STATUS: Eyes are closed. There are no eye opening seem to deep painful stimuli.  CRANIAL NERVES: Pupils are equal, round and reactive to light; extra ocular movements are full by CEPHALIC REFLEXES, upper and lower facial muscles are symmetric, there is no flattening of the nasolabial folds. Cornea reflexes are diminished bilaterally.  MOTOR: The patient has antigravity strength bilaterally in upper extremities to deep painful stem light. He withdraws appropriately.  COORDINATION: Left finger to nose is normal, right finger to nose is normal, No rest tremor; no intention tremor; no  postural tremor; no bradykinesia.  REFLEXES: Deep tendon reflexes are symmetrical and normal.   SENSATION: Response to deep painful stimuli bilaterally.       Blood pressure 108/67, pulse 76, temperature (!) 94.6 F (34.8 C), temperature source Axillary, resp. rate 16, height 6' 2"  (1.88 m), weight 184 lb 15.5 oz (83.9 kg), SpO2 100 %.  Past Medical History:  Diagnosis Date  . Bell palsy   . Kidney stones     Past Surgical History:  Procedure Laterality Date  . ANKLE RECONSTRUCTION      History reviewed. No pertinent family history.  Social History:  reports that he has been smoking.  He has a 30.00 pack-year smoking history. He has never used smokeless tobacco. He reports that he uses drugs, including Marijuana and Methamphetamines. He reports that he does not drink alcohol.  Allergies:  Allergies  Allergen Reactions  . Morphine And Related     Per significant other    Medications: Prior to Admission medications   Not on File    Scheduled Meds: . acyclovir  10 mg/kg Intravenous Q8H  . aspirin  300 mg Rectal Daily  . cefTRIAXone (ROCEPHIN)  IV  2 g Intravenous Q12H  . chlorhexidine gluconate (MEDLINE KIT)  15 mL Mouth Rinse BID  . docusate sodium  100 mg Oral BID  . famotidine (PEPCID) IV  20 mg Intravenous Q12H  . folic acid  1 mg Oral Daily  . insulin aspart  0-9 Units Subcutaneous Q4H  . mouth rinse  15 mL Mouth Rinse QID  . multivitamin with minerals  1  tablet Oral Daily  . potassium chloride  10 mEq Intravenous Q1 Hr x 4  . sodium chloride  1,000 mL Intravenous Once  . sodium chloride flush  3 mL Intravenous Q12H  . thiamine  100 mg Oral Daily   Or  . thiamine  100 mg Intravenous Daily  . vancomycin  1,000 mg Intravenous Q8H   Continuous Infusions: . dextrose 5 % and 0.9 % NaCl with KCl 20 mEq/L 125 mL/hr at 04/07/16 0833  . fentaNYL infusion INTRAVENOUS 10 mcg/hr (04/07/16 0646)  . lactated ringers 125 mL/hr at 04/07/16 0451  . midazolam (VERSED)  infusion 1 mg/hr (04/07/16 0600)   PRN Meds:.acetaminophen **OR** acetaminophen, LORazepam **OR** LORazepam, ondansetron **OR** ondansetron (ZOFRAN) IV     Results for orders placed or performed during the hospital encounter of 04/06/16 (from the past 48 hour(s))  Comprehensive metabolic panel     Status: Abnormal   Collection Time: 04/06/16 11:29 PM  Result Value Ref Range   Sodium 138 135 - 145 mmol/L   Potassium 3.2 (L) 3.5 - 5.1 mmol/L   Chloride 106 101 - 111 mmol/L   CO2 21 (L) 22 - 32 mmol/L   Glucose, Bld 122 (H) 65 - 99 mg/dL   BUN 17 6 - 20 mg/dL   Creatinine, Ser 1.04 0.61 - 1.24 mg/dL   Calcium 9.2 8.9 - 10.3 mg/dL   Total Protein 7.3 6.5 - 8.1 g/dL   Albumin 4.5 3.5 - 5.0 g/dL   AST 63 (H) 15 - 41 U/L   ALT 34 17 - 63 U/L   Alkaline Phosphatase 63 38 - 126 U/L   Total Bilirubin 1.2 0.3 - 1.2 mg/dL   GFR calc non Af Amer >60 >60 mL/min   GFR calc Af Amer >60 >60 mL/min    Comment: (NOTE) The eGFR has been calculated using the CKD EPI equation. This calculation has not been validated in all clinical situations. eGFR's persistently <60 mL/min signify possible Chronic Kidney Disease.    Anion gap 11 5 - 15  Ethanol     Status: Abnormal   Collection Time: 04/06/16 11:29 PM  Result Value Ref Range   Alcohol, Ethyl (B) 8 (H) <5 mg/dL    Comment:        LOWEST DETECTABLE LIMIT FOR SERUM ALCOHOL IS 5 mg/dL FOR MEDICAL PURPOSES ONLY   Acetaminophen level     Status: Abnormal   Collection Time: 04/06/16 11:29 PM  Result Value Ref Range   Acetaminophen (Tylenol), Serum <10 (L) 10 - 30 ug/mL    Comment:        THERAPEUTIC CONCENTRATIONS VARY SIGNIFICANTLY. A RANGE OF 10-30 ug/mL MAY BE AN EFFECTIVE CONCENTRATION FOR MANY PATIENTS. HOWEVER, SOME ARE BEST TREATED AT CONCENTRATIONS OUTSIDE THIS RANGE. ACETAMINOPHEN CONCENTRATIONS >150 ug/mL AT 4 HOURS AFTER INGESTION AND >50 ug/mL AT 12 HOURS AFTER INGESTION ARE OFTEN ASSOCIATED WITH TOXIC REACTIONS.     Salicylate level     Status: None   Collection Time: 04/06/16 11:29 PM  Result Value Ref Range   Salicylate Lvl <5.7 2.8 - 30.0 mg/dL  CBC with Differential     Status: None   Collection Time: 04/06/16 11:29 PM  Result Value Ref Range   WBC 8.7 4.0 - 10.5 K/uL   RBC 4.58 4.22 - 5.81 MIL/uL   Hemoglobin 14.3 13.0 - 17.0 g/dL   HCT 41.8 39.0 - 52.0 %   MCV 91.3 78.0 - 100.0 fL   MCH 31.2 26.0 - 34.0 pg  MCHC 34.2 30.0 - 36.0 g/dL   RDW 14.3 11.5 - 15.5 %   Platelets 193 150 - 400 K/uL   Neutrophils Relative % 70 %   Neutro Abs 6.0 1.7 - 7.7 K/uL   Lymphocytes Relative 20 %   Lymphs Abs 1.8 0.7 - 4.0 K/uL   Monocytes Relative 9 %   Monocytes Absolute 0.8 0.1 - 1.0 K/uL   Eosinophils Relative 1 %   Eosinophils Absolute 0.1 0.0 - 0.7 K/uL   Basophils Relative 0 %   Basophils Absolute 0.0 0.0 - 0.1 K/uL  Troponin I     Status: None   Collection Time: 04/06/16 11:29 PM  Result Value Ref Range   Troponin I <0.03 <0.03 ng/mL  Urine rapid drug screen (hosp performed)     Status: Abnormal   Collection Time: 04/06/16 11:50 PM  Result Value Ref Range   Opiates NONE DETECTED NONE DETECTED   Cocaine NONE DETECTED NONE DETECTED   Benzodiazepines NONE DETECTED NONE DETECTED   Amphetamines POSITIVE (A) NONE DETECTED   Tetrahydrocannabinol NONE DETECTED NONE DETECTED   Barbiturates NONE DETECTED NONE DETECTED    Comment:        DRUG SCREEN FOR MEDICAL PURPOSES ONLY.  IF CONFIRMATION IS NEEDED FOR ANY PURPOSE, NOTIFY LAB WITHIN 5 DAYS.        LOWEST DETECTABLE LIMITS FOR URINE DRUG SCREEN Drug Class       Cutoff (ng/mL) Amphetamine      1000 Barbiturate      200 Benzodiazepine   062 Tricyclics       376 Opiates          300 Cocaine          300 THC              50   Urinalysis, Routine w reflex microscopic     Status: Abnormal   Collection Time: 04/06/16 11:50 PM  Result Value Ref Range   Color, Urine YELLOW YELLOW   APPearance CLEAR CLEAR   Specific Gravity, Urine 1.010  1.005 - 1.030   pH 6.0 5.0 - 8.0   Glucose, UA NEGATIVE NEGATIVE mg/dL   Hgb urine dipstick LARGE (A) NEGATIVE   Bilirubin Urine LARGE (A) NEGATIVE   Ketones, ur TRACE (A) NEGATIVE mg/dL   Protein, ur NEGATIVE NEGATIVE mg/dL   Nitrite NEGATIVE NEGATIVE   Leukocytes, UA SMALL (A) NEGATIVE  Urine microscopic-add on     Status: Abnormal   Collection Time: 04/06/16 11:50 PM  Result Value Ref Range   Squamous Epithelial / LPF 0-5 (A) NONE SEEN   WBC, UA TOO NUMEROUS TO COUNT 0 - 5 WBC/hpf   RBC / HPF TOO NUMEROUS TO COUNT 0 - 5 RBC/hpf   Bacteria, UA MANY (A) NONE SEEN   Casts HYALINE CASTS (A) NEGATIVE    Comment: WBC CAST CELLULAR    Urine-Other MUCOUS PRESENT   Blood gas, arterial ([facility] & [facility] ONLY)     Status: Abnormal   Collection Time: 04/07/16 12:19 AM  Result Value Ref Range   FIO2 100.00    Delivery systems VENTILATOR    Mode PRESSURE REGULATED VOLUME CONTROL    VT 600 mL   LHR 16.0 resp/min   Peep/cpap 5.0 cm H20   pH, Arterial 7.349 (L) 7.350 - 7.450   pCO2 arterial 37.8 32.0 - 48.0 mmHg   pO2, Arterial 414.0 (H) 83.0 - 108.0 mmHg   Bicarbonate 20.9 20.0 - 28.0 mmol/L   Acid-base deficit 4.4 (  H) 0.0 - 2.0 mmol/L   O2 Saturation 99.5 %   Patient temperature 37.0    Collection site RIGHT RADIAL    Drawn by [id number]    Sample type ARTERIAL    Allens test (pass/fail) PASS PASS  Culture, blood (routine x 2)     Status: None (Preliminary result)   Collection Time: 04/07/16 12:56 AM  Result Value Ref Range   Specimen Description BLOOD LEFT ANTECUBITAL    Special Requests      BOTTLES DRAWN AEROBIC AND ANAEROBIC AEB 10CC ANA 5CC   Culture NO GROWTH < 12 HOURS    Report Status PENDING   Lactic acid, plasma     Status: None   Collection Time: 04/07/16 12:57 AM  Result Value Ref Range   Lactic Acid, Venous 0.8 0.5 - 1.9 mmol/L  Culture, blood (routine x 2)     Status: None (Preliminary result)   Collection Time: 04/07/16  1:06 AM  Result Value Ref Range   Specimen  Description BLOOD LEFT HAND    Special Requests BOTTLES DRAWN AEROBIC AND ANAEROBIC 8CC EACH    Culture NO GROWTH < 12 HOURS    Report Status PENDING   MRSA PCR Screening     Status: None   Collection Time: 04/07/16  4:30 AM  Result Value Ref Range   MRSA by PCR NEGATIVE NEGATIVE    Comment:        The GeneXpert MRSA Assay (FDA approved for NASAL specimens only), is one component of a comprehensive MRSA colonization surveillance program. It is not intended to diagnose MRSA infection nor to guide or monitor treatment for MRSA infections.   Basic metabolic panel     Status: Abnormal   Collection Time: 04/07/16  4:41 AM  Result Value Ref Range   Sodium 138 135 - 145 mmol/L   Potassium 3.1 (L) 3.5 - 5.1 mmol/L   Chloride 112 (H) 101 - 111 mmol/L   CO2 20 (L) 22 - 32 mmol/L   Glucose, Bld 96 65 - 99 mg/dL   BUN 16 6 - 20 mg/dL   Creatinine, Ser 0.95 0.61 - 1.24 mg/dL   Calcium 7.9 (L) 8.9 - 10.3 mg/dL   GFR calc non Af Amer >60 >60 mL/min   GFR calc Af Amer >60 >60 mL/min    Comment: (NOTE) The eGFR has been calculated using the CKD EPI equation. This calculation has not been validated in all clinical situations. eGFR's persistently <60 mL/min signify possible Chronic Kidney Disease.    Anion gap 6 5 - 15  CBC     Status: Abnormal   Collection Time: 04/07/16  4:41 AM  Result Value Ref Range   WBC 5.3 4.0 - 10.5 K/uL   RBC 4.00 (L) 4.22 - 5.81 MIL/uL   Hemoglobin 12.2 (L) 13.0 - 17.0 g/dL   HCT 36.4 (L) 39.0 - 52.0 %   MCV 91.0 78.0 - 100.0 fL   MCH 30.5 26.0 - 34.0 pg   MCHC 33.5 30.0 - 36.0 g/dL   RDW 14.5 11.5 - 15.5 %   Platelets 156 150 - 400 K/uL  Lactic acid, plasma     Status: None   Collection Time: 04/07/16  4:41 AM  Result Value Ref Range   Lactic Acid, Venous 0.7 0.5 - 1.9 mmol/L  Procalcitonin     Status: None   Collection Time: 04/07/16  4:41 AM  Result Value Ref Range   Procalcitonin <0.10 ng/mL    Comment:  Interpretation: PCT  (Procalcitonin) <= 0.5 ng/mL: Systemic infection (sepsis) is not likely. Local bacterial infection is possible. (NOTE)         ICU PCT Algorithm               Non ICU PCT Algorithm    ----------------------------     ------------------------------         PCT < 0.25 ng/mL                 PCT < 0.1 ng/mL     Stopping of antibiotics            Stopping of antibiotics       strongly encouraged.               strongly encouraged.    ----------------------------     ------------------------------       PCT level decrease by               PCT < 0.25 ng/mL       >= 80% from peak PCT       OR PCT 0.25 - 0.5 ng/mL          Stopping of antibiotics                                             encouraged.     Stopping of antibiotics           encouraged.    ----------------------------     ------------------------------       PCT level decrease by              PCT >= 0.25 ng/mL       < 80% from peak PCT        AND PCT >= 0.5 ng/mL            Continuin g antibiotics                                              encouraged.       Continuing antibiotics            encouraged.    ----------------------------     ------------------------------     PCT level increase compared          PCT > 0.5 ng/mL         with peak PCT AND          PCT >= 0.5 ng/mL             Escalation of antibiotics                                          strongly encouraged.      Escalation of antibiotics        strongly encouraged.   Protime-INR     Status: None   Collection Time: 04/07/16  4:41 AM  Result Value Ref Range   Prothrombin Time 14.9 11.4 - 15.2 seconds   INR 1.16   APTT     Status: None   Collection Time: 04/07/16  4:41 AM  Result Value Ref Range   aPTT 32 24 -  36 seconds  Lactic acid, plasma     Status: None   Collection Time: 04/07/16  8:19 AM  Result Value Ref Range   Lactic Acid, Venous 1.0 0.5 - 1.9 mmol/L  Glucose, capillary     Status: None   Collection Time: 04/07/16  9:06 AM  Result Value Ref  Range   Glucose-Capillary 94 65 - 99 mg/dL    Studies/Results:  HEAD CT  normal   Marvelle Caudill A. Merlene Laughter, M.D.  Diplomate, Tax adviser of Psychiatry and Neurology ( Neurology). 04/07/2016, 9:26 AM

## 2016-04-07 NOTE — Progress Notes (Signed)
Initial Nutrition Assessment  DOCUMENTATION CODES:  Not applicable  INTERVENTION:  If unable to extubate within 36 hrs, recommend TF initiation  via OGT/NGT with Vital AF 1.2 at goal rate of 65 ml/h (1560 ml per day) to provide 1872 kcals, 117 gm protein, 1265 ml free water daily.  NUTRITION DIAGNOSIS:  Inadequate oral intake related to inability to eat as evidenced by NPO status.  GOAL:  Patient will meet greater than or equal to 90% of their needs  MONITOR:  Vent status, Diet advancement, Labs, I & O's, TF tolerance  REASON FOR ASSESSMENT:  Ventilator    ASSESSMENT:  45 y/o male PMHx Bells Palsy and kidney stones.. Was found unresponsive at home. Pt had seizures in ED and was intubated for airway protection.   Pt intubated, sedated.   Abdomen: Soft, non distended.   Physical Exam: No muscle/fat wasting. Visibile rash on chest  Patient is currently intubated on ventilator support MV: 9.7 L/min Temp (24hrs), Avg:96.2 F (35.7 C), Min:94.6 F (34.8 C), Max:98.1 F (36.7 C)  Propofol: none  Labs: Hypokalemic, BG  WDL.  Medications:  Colace, Folate, Pepcid, thiamin, insulin, mvi, vanc, D5 NS with KCL, Versed/fentanyl   Recent Labs Lab 04/06/16 2329 04/07/16 0441  NA 138 138  K 3.2* 3.1*  CL 106 112*  CO2 21* 20*  BUN 17 16  CREATININE 1.04 0.95  CALCIUM 9.2 7.9*  GLUCOSE 122* 96   Diet Order:  Diet NPO time specified  Skin: Excoriated areas and abrasion to Leg and rash on chest  Last BM:  Unknown  Height:  Ht Readings from Last 1 Encounters:  04/07/16 6\' 2"  (1.88 m)   Weight:  Wt Readings from Last 1 Encounters:  04/07/16 184 lb 15.5 oz (83.9 kg)   Ideal Body Weight:  86.36 kg  BMI:  Body mass index is 23.75 kg/m.  Estimated Nutritional Needs:  Kcal:  1933 kcals Protein:  100-120 g (1.2-1.4 g/kg bw) Fluid:  >2 Liters  EDUCATION NEEDS:  No education needs identified at this time  Alejandro Conner RD, LDN, CNSC Clinical Nutrition Pager:  [phone] 04/07/2016 1:44 PM

## 2016-04-07 NOTE — ED Notes (Signed)
Dr. Devoria AlbeIva Knapp attempted LP without success.

## 2016-04-07 NOTE — ED Notes (Signed)
Decerebrate posturing noted in bilateral arms, lasting for approximately 60 seconds.  Hospitalist notified.

## 2016-04-07 NOTE — H&P (Signed)
History and Physical    Alejandro HeckBrian Kallenbach VHQ:[id number] DOB: [birth year] DOA: 04/06/2016  PCP: No PCP Per Patient Consultants:  None Patient coming from: home - lives with fiance; [phone]  Chief Complaint: unresponsive  HPI: Alejandro Conner is a 45 y.o. male with medical history significant of nephrolithiasis and Bell's palsy presenting with unresponsiveness.  Patient is unable to provide history; it was obtained by his fiance.  She reports that he has had a cold.  He thought he was getting better.  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  While out of town, noticed eye drooling and mouth was drooping on the right.  Was having so much trouble that he asked girlfriend to come pick him up.  He has had bells's palsy in the past and thought it might be recurring.  Asked girlfriend to go get vitamins for him from the store.  She and her son came home and couldn't get in the house.  The patient was lying in living room floor and seemed unable to get up to the door.  Called 911;  Difficulty getting him to the door but he was able to eventually unlatch it.  Maybe cooking and tried to burn label off bottle? It was Etodolac.  Otherwise nothing was obviously out of order.  *Called by ER nurse for seizure-like activity.  Posturing of the upper extremities with extension of the back, with repetitive movements lasting for about 60 seconds.  By the time I went to assess the patient, the movements had ended.   ED Course: Per Dr. Lynelle DoctorKnapp: 11:25 PM Will order urinalysis, urine rapid drug screen,  CT head wo contrast and DG chest port 1 view.   After arrival patient was given Narcan 1 mg IV. Before the Narcan and he was having nonpurposeful movement of his arms and trying to sit up. He was nonverbal and would not follow commands. After the Narcan and he laid on the stretcher and became quiet and then started having apneic episodes. Patient was prepared for intubation.   00:10 AM Pt's girlfriend states that today he was  slurring his speech and drooling. Pt was recently diagnosed with Bell's palsy and had run out of his medication. When she came home, she found pt unconscious on the floor. She states that her glass coffee table was shattered as if he had fallen on it. Pt was able to crawl and unlock the door when EMS arrived, but then passed out again.   After reviewing his lab work patient was started on Rocephin for possible UTI.  01:08 AM Dr Basil DessJ Akiva Josey, admit to ICU  Review of Systems:  Unable to obtain = patient is intubated/sedated    Past Medical History:  Diagnosis Date  . Bell palsy   . Kidney stones     Past Surgical History:  Procedure Laterality Date  . ANKLE RECONSTRUCTION      Social History   Social History  . Marital status: Significant Other    Spouse name: N/A  . Number of children: N/A  . Years of education: N/A   Occupational History  . painter    Social History Main Topics  . Smoking status: Current Every Day Smoker    Packs/day: 1.00    Years: 30.00  . Smokeless tobacco: Never Used  . Alcohol use No  . Drug use:     Types: Marijuana, Methamphetamines     Comment: unsure of how recently  . Sexual activity: Not on file  Other Topics Concern  . Not on file   Social History Narrative  . No narrative on file    Allergies  Allergen Reactions  . Morphine And Related     Per significant other    History reviewed. No pertinent family history.  Prior to Admission medications   Not on File    Physical Exam: Vitals:   04/07/16 0233 04/07/16 0240 04/07/16 0242 04/07/16 0250  BP: 90/68 104/70  128/83  Pulse: 69 64  76  Resp: 16 16  16   Temp:   (!) 95.2 F (35.1 C)   TempSrc:   Rectal   SpO2: 100% 100%  100%     General:  Appears calm and comfortable and is NAD; intubated/sedated Eyes:  PERRL, normal lids, iris NWG:NFAOZHY normal lips & tongue, mmm Neck: no LAD, masses or thyromegaly Cardiovascular:  RRR, no m/r/g. No LE edema.  Respiratory:  CTA  bilaterally, no w/r/r. Normal respiratory effort. Abdomen:  soft, ntnd, NABS Skin:  Scattered lesions on chest across both dermatomes, maculopapular rather than vesicular - although with some excoriation Musculoskeletal: grossly normal tone BUE/BLE, good passive ROM, no bony abnormality Psychiatric: intubated/sedated Neurologic: unable to evaluate  Labs on Admission: I have personally reviewed following labs and imaging studies  CBC:  Recent Labs Lab 04/06/16 2329  WBC 8.7  NEUTROABS 6.0  HGB 14.3  HCT 41.8  MCV 91.3  PLT 193   Basic Metabolic Panel:  Recent Labs Lab 04/06/16 2329  NA 138  K 3.2*  CL 106  CO2 21*  GLUCOSE 122*  BUN 17  CREATININE 1.04  CALCIUM 9.2   GFR: CrCl cannot be calculated (Unknown ideal weight.). Liver Function Tests:  Recent Labs Lab 04/06/16 2329  AST 63*  ALT 34  ALKPHOS 63  BILITOT 1.2  PROT 7.3  ALBUMIN 4.5   No results for input(s): LIPASE, AMYLASE in the last 168 hours. No results for input(s): AMMONIA in the last 168 hours. Coagulation Profile: No results for input(s): INR, PROTIME in the last 168 hours. Cardiac Enzymes:  Recent Labs Lab 04/06/16 2329  TROPONINI <0.03   BNP (last 3 results) No results for input(s): PROBNP in the last 8760 hours. HbA1C: No results for input(s): HGBA1C in the last 72 hours. CBG: No results for input(s): GLUCAP in the last 168 hours. Lipid Profile: No results for input(s): CHOL, HDL, LDLCALC, TRIG, CHOLHDL, LDLDIRECT in the last 72 hours. Thyroid Function Tests: No results for input(s): TSH, T4TOTAL, FREET4, T3FREE, THYROIDAB in the last 72 hours. Anemia Panel: No results for input(s): VITAMINB12, FOLATE, FERRITIN, TIBC, IRON, RETICCTPCT in the last 72 hours. Urine analysis:    Component Value Date/Time   COLORURINE YELLOW 04/06/2016 2350   APPEARANCEUR CLEAR 04/06/2016 2350   LABSPEC 1.010 04/06/2016 2350   PHURINE 6.0 04/06/2016 2350   GLUCOSEU NEGATIVE 04/06/2016 2350    HGBUR LARGE (A) 04/06/2016 2350   BILIRUBINUR LARGE (A) 04/06/2016 2350   KETONESUR TRACE (A) 04/06/2016 2350   PROTEINUR NEGATIVE 04/06/2016 2350   NITRITE NEGATIVE 04/06/2016 2350   LEUKOCYTESUR SMALL (A) 04/06/2016 2350    Creatinine Clearance: CrCl cannot be calculated (Unknown ideal weight.).  Sepsis Labs: @LABRCNTIP (procalcitonin:4,lacticidven:4) )No results found for this or any previous visit (from the past 240 hour(s)).   Radiological Exams on Admission: Ct Head Wo Contrast  Result Date: 04/07/2016 CLINICAL DATA:  Altered mental status, intubation. Possible drug overdose. EXAM: CT HEAD WITHOUT CONTRAST TECHNIQUE: Contiguous axial images were obtained from the base of the  skull through the vertex without intravenous contrast. COMPARISON:  None. FINDINGS: BRAIN: The ventricles and sulci are normal. No intraparenchymal hemorrhage, mass effect nor midline shift. No acute large vascular territory infarcts. No abnormal extra-axial fluid collections. Basal cisterns are patent. VASCULAR: Unremarkable. SKULL/SOFT TISSUES: No skull fracture. No significant soft tissue swelling. ORBITS/SINUSES: The included ocular globes and orbital contents are normal.Mild paranasal sinus mucosal thickening. Mastoid air cells are well aerated. OTHER: Fullness of the nasopharyngeal soft tissues, attributable to intubation. IMPRESSION: Normal CT HEAD. Electronically Signed   By: Awilda Metro M.D.   On: 04/07/2016 00:38   Dg Chest Port 1 View  Result Date: 04/06/2016 CLINICAL DATA:  45 year old male with altered mental status. Status post intubation. EXAM: PORTABLE CHEST 1 VIEW COMPARISON:  None. FINDINGS: An endotracheal tube is noted with tip approximately 3.6 cm above the carina. An enteric tube courses into the left upper abdomen with tip under the left hemidiaphragm, likely in the gastric fundus. Mild diffuse interstitial coarsening. No focal consolidation, pleural effusion, or pneumothorax. Top-normal  cardiac size. No acute osseous pathology. IMPRESSION: Endotracheal tube above the carina and enteric tube with tip in the left upper abdomen likely in the stomach. Electronically Signed   By: Elgie Collard M.D.   On: 04/06/2016 23:57    EKG: Independently reviewed.  NSR; poor R wave progression with no other evidence of acute ischemia  Assessment/Plan Principal Problem:   Altered mental status Active Problems:   Hypokalemia   Neuro/Psych -Patient presenting with AMS -Differential is vast -With c/o eye "drooling" and facial droop, even with h/o Bell's palsy, CVA must be considered.  Patient had negative CT but may still have an evolving CVA.  Unable to obtain MRI overnight at [facility] and our plan would not likely change so will not transfer for MRI at this time.    -Will admit to ICU for ongoing monitoring with telemetry  -ASA 300 mg PR given  -Hypotensive so no need for permissive HTN  -There is no other indication that this is the concern at this time -Patient with possible seizure activity in the ER.  Will hold off on loading with Keppra unless additional seizure activity takes place.  If it does, will call neuro in [place] to discuss patient. -Polysubstance abuse - patient with AST:ALT ratio consistent with ETOH abuse, along with positive ETOH (minimally); also with UDS + for amphetamines and fiance reports h/o methamphetamine abuse remotely.  This currently seems like most likely etiology of symptoms given timeframe, response to Narcan, etc.  Intubated for airway protection. -Possible sepsis with hypothermia, lesions on chest.  While this does not appear to be meningococcus, HSV/VZV meningitis or encephalitis are considerations.  Dr. Lynelle Doctor will attempt LP in the ER.  If unsuccessful, will cover empirically with Vanc/Rocephin/Acyclovir (pharmacy to dose) while awaiting capacity for fluoro-guided LP in the AM. -Patient is intubated/sedated with Versed/Fentanyl, will continue for  sedation/pain control.  CV -Hypotension, possibly resulting from sepsis.  -Has responded to bolus x 2L, will completed sepsis bolus and then provide MIVF. -No known underlying CVD.  Pulm -Intubated for airway protection. -ABG shows adequate ventilation/oxygenation on current settings. -Will continue on mechanical ventilation at least through the night. -No known respiratory issues. -CXR shows no acute disease.  GI -No known GI issues. -Gastric tube is in the stomach on xray.  Renal -Creatinine is 1.04, unknown baseline. -Will follow with rehydration.  FEN -Mild hypokalemia, will replete. -Mild hyperglycemia, will follow. -NPO for now.  If he is  expected to stay intubated, he is likely to benefit from early tube feeding.  ID -Hypothermic on admission but no other SIRS criteria to indicate sepsis. -Urine is possible source of infections - small LE and negative nitrites, but he has many bacteria, mucous, and TNTC WBC. -Lactic acid is normal (will trend to ensure this continues). -That said, it is a bit unusual that he would not mount a tachycardia response in this circumstance.   -He has had borderline to frank hypotension (as low as 82/58), which is fluid-responsive. -For now will treat empirically with Rocephin and Vanc - which is appropriate for meningitis or UTI. -Will also add Acyclovir for viral encephalitis coverage while awaiting LP results.  Heme -Normal CBC.  Endocrine -Mild hyperglycemia, which may simply be a reaction to the stress his body is under at this time.  -Will trend.  MSK -No apparent injury/illness is evident at this time.  Derm -Rash on chest, bilateral. -Distribution across chest decreases likelihood of shingles. -Rash is not pustular. -Rash is excoriated. -Will follow without additional intervention other than as above.     DVT prophylaxis:  Lovenox  Code Status:  Full - confirmed with fiance (no other family is present at this  time) Family Communication: Fiance present throughout Disposition Plan: To be determined Consults called: None Admission status: Admit - It is my clinical opinion that admission to INPATIENT is reasonable and necessary because this patient will require at least 2 midnights in the hospital to treat this condition based on the medical complexity of the problems presented.  Given the aforementioned information, the predictability of an adverse outcome is felt to be significant.    Jonah Blue MD [facility]  If 7PM-7AM, please contact night-coverage [email or web address] Password [email or web address]  04/07/2016, 3:01 AM

## 2016-04-08 ENCOUNTER — Inpatient Hospital Stay (HOSPITAL_COMMUNITY): Payer: Self-pay

## 2016-04-08 ENCOUNTER — Inpatient Hospital Stay (HOSPITAL_COMMUNITY)
Admit: 2016-04-08 | Discharge: 2016-04-08 | Disposition: A | Discharge status: Home / Self Care | Payer: Self-pay | Attending: Neurology | Admitting: Neurology

## 2016-04-08 DIAGNOSIS — R569 Unspecified convulsions: Secondary | ICD-10-CM

## 2016-04-08 DIAGNOSIS — E876 Hypokalemia: Secondary | ICD-10-CM

## 2016-04-08 LAB — ROCKY MTN SPOTTED FVR ABS PNL(IGG+IGM)
RMSF IGG: NEGATIVE
RMSF IGM: 0.38 {index} (ref 0.00–0.89)

## 2016-04-08 LAB — URINE CULTURE
CULTURE: NO GROWTH
SPECIAL REQUESTS: NORMAL

## 2016-04-08 LAB — BASIC METABOLIC PANEL
BUN: 10 mg/dL (ref 6–20)
CALCIUM: 7.9 mg/dL — AB (ref 8.9–10.3)
CO2: 21 mmol/L — ABNORMAL LOW (ref 22–32)
CREATININE: 1.06 mg/dL (ref 0.61–1.24)
Chloride: 118 mmol/L — ABNORMAL HIGH (ref 101–111)
GLUCOSE: 107 mg/dL — AB (ref 65–99)
Potassium: 3.2 mmol/L — ABNORMAL LOW (ref 3.5–5.1)
Sodium: 140 mmol/L (ref 135–145)

## 2016-04-08 LAB — HIV ANTIBODY (ROUTINE TESTING W REFLEX): HIV Screen 4th Generation wRfx: NONREACTIVE

## 2016-04-08 LAB — GLUCOSE, CAPILLARY
GLUCOSE-CAPILLARY: 79 mg/dL (ref 65–99)
GLUCOSE-CAPILLARY: 81 mg/dL (ref 65–99)
Glucose-Capillary: 84 mg/dL (ref 65–99)
Glucose-Capillary: 84 mg/dL (ref 65–99)
Glucose-Capillary: 87 mg/dL (ref 65–99)

## 2016-04-08 MED ORDER — PANTOPRAZOLE SODIUM 40 MG PO TBEC
40.0000 mg | DELAYED_RELEASE_TABLET | Freq: Every day | ORAL | Status: DC
  Administered 2016-04-09 – 2016-04-11 (×3): 40 mg via ORAL
  Filled 2016-04-08 (×3): qty 1

## 2016-04-08 MED ORDER — MIDAZOLAM 50MG/50ML (1MG/ML) PREMIX INFUSION
INTRAVENOUS | Status: AC
  Filled 2016-04-08: qty 50

## 2016-04-08 NOTE — Progress Notes (Addendum)
PROGRESS NOTE    Alejandro Conner  ZOX:[id number] DOB: [birth year] DOA: 04/06/2016 PCP: No PCP Per Patient    Brief Narrative: Patient was found unconscious, after a "cold" a week prior, had a seizure in the ED, intubated for airway protection, LP attempted failed, LP held as he had one dose of Lovenox yesterday, Tx with IV Van/Rocephin 2gQ12/ Acyclovir and maintained intubated.  Work up showed amphetamine and trace alcohol level.  He was found to also have UTI.  He was started on CIWA, and IV Keppra was given.  Neurology was consulted, and recommend continuation of Tx. Appreciate PCCM's help as well.  This am, he is more awake and followed command.     Assessment & Plan:     1. Acute encephalopathy:  Still unclear, but it is possible that he has alcohol withdrawal seizure.  Though he denied any drinking, he has positive ethyl alcohol in his system.  Infectious and primary or secondary seizure can't be excluded, as well as other immunological diseases.  He is better today, clearly, so it is encouraging.  Given he was treated for meningitis and encephalitis, we will need to proceed with spinal tap today.  Will d/c airborn only for the procedure (low risk), until LP result is obtained and cleared.  Dr Luan Pulling will help with extubation today.  Continue with same Tx.  Will use ativan PRN for CIWA, and Precedex as needed.   Keep in ICU today.  2. Seizure:  Unclear if metabolic derrangement, primary, or alcohol withdrawal.  Will continue with IV Keppra for now.   DVT prophylaxis:  SCD Code Status: FULL CODE.  Family Communication: Fiance thus far.  Disposition Plan: to home when appropriate.   Consultants:   Neuro  PCCM.   Procedures:   Intubation  Spinal tap.   Antimicrobials: Anti-infectives    Start     Dose/Rate Route Frequency Ordered Stop   04/07/16 1800  cefTRIAXone (ROCEPHIN) 2 g in dextrose 5 % 50 mL IVPB     2 g 100 mL/hr over 30 Minutes Intravenous Every 12 hours 04/07/16 0734      04/07/16 1300  vancomycin (VANCOCIN) IVPB 1000 mg/200 mL premix     1,000 mg 200 mL/hr over 60 Minutes Intravenous Every 8 hours 04/07/16 0735     04/07/16 0430  acyclovir (ZOVIRAX) 840 mg in dextrose 5 % 150 mL IVPB     10 mg/kg  83.9 kg 166.8 mL/hr over 60 Minutes Intravenous Every 8 hours 04/07/16 0407     04/07/16 0430  vancomycin (VANCOCIN) IVPB 1000 mg/200 mL premix     1,000 mg 200 mL/hr over 60 Minutes Intravenous  Once 04/07/16 0428 04/07/16 0551   04/07/16 0415  cefTRIAXone (ROCEPHIN) 1 g in dextrose 5 % 50 mL IVPB     1 g 100 mL/hr over 30 Minutes Intravenous NOW 04/07/16 0402 04/07/16 0625   04/07/16 0045  cefTRIAXone (ROCEPHIN) 1 g in dextrose 5 % 50 mL IVPB     1 g 100 mL/hr over 30 Minutes Intravenous  Once 04/07/16 0044 04/07/16 0143       Subjective:  None.    Objective: Vitals:   04/07/16 2300 04/08/16 0000 04/08/16 0400 04/08/16 0600  BP: 109/72   106/67  Pulse: 84     Resp: 16     Temp:  (!) 96.5 F (35.8 C) 97.9 F (36.6 C)   TempSrc:  Oral Rectal   SpO2: 100%     Weight:   90.3  kg (199 lb 1.2 oz)   Height:        Intake/Output Summary (Last 24 hours) at 04/08/16 0726 Last data filed at 04/08/16 0522  Gross per 24 hour  Intake          2129.23 ml  Output             1200 ml  Net           929.23 ml   Filed Weights   04/07/16 0415 04/08/16 0400  Weight: 83.9 kg (184 lb 15.5 oz) 90.3 kg (199 lb 1.2 oz)    Examination:  General exam: Appears calm and comfortable  Respiratory system: Clear to auscultation. Intubated.  Cardiovascular system: S1 & S2 heard, RRR. No JVD, murmurs, rubs, gallops or clicks. No pedal edema. Gastrointestinal system: Abdomen is nondistended, soft and nontender. No organomegaly or masses felt. Normal bowel sounds heard. Central nervous system: Alert and oriented. No focal neurological deficits. Extremities: Symmetric 5 x 5 power. Skin: No rashes, lesions or ulcers.   Suprapubic rash appears to be foliculitis.    Psychiatry: Judgement and insight appear normal. Mood & affect appropriate.   Data Reviewed: I have personally reviewed following labs and imaging studies  CBC:  Recent Labs Lab 04/06/16 2329 04/07/16 0441  WBC 8.7 5.3  NEUTROABS 6.0  --   HGB 14.3 12.2*  HCT 41.8 36.4*  MCV 91.3 91.0  PLT 193 161   Basic Metabolic Panel:  Recent Labs Lab 04/06/16 2329 04/07/16 0441 04/08/16 0516  NA 138 138 140  K 3.2* 3.1* 3.2*  CL 106 112* 118*  CO2 21* 20* 21*  GLUCOSE 122* 96 107*  BUN 17 16 10   CREATININE 1.04 0.95 1.06  CALCIUM 9.2 7.9* 7.9*   GFR: Estimated Creatinine Clearance: 100.2 mL/min (by C-G formula based on SCr of 1.06 mg/dL). Liver Function Tests:  Recent Labs Lab 04/06/16 2329  AST 63*  ALT 34  ALKPHOS 63  BILITOT 1.2  PROT 7.3  ALBUMIN 4.5   Coagulation Profile:  Recent Labs Lab 04/07/16 0441  INR 1.16   Cardiac Enzymes:  Recent Labs Lab 04/06/16 2329 04/07/16 0954  CKTOTAL  --  856*  TROPONINI <0.03  --    CBG:  Recent Labs Lab 04/07/16 1157 04/07/16 1652 04/07/16 2022 04/08/16 0014 04/08/16 0441  GLUCAP 104* 100* 113* 84 84    Recent Labs  04/07/16 0954  VITAMINB12 220   Sepsis Labs:  Recent Labs Lab 04/07/16 0057 04/07/16 0441 04/07/16 0819  PROCALCITON  --  <0.10  --   LATICACIDVEN 0.8 0.7 1.0    Recent Results (from the past 240 hour(s))  Culture, blood (routine x 2)     Status: None (Preliminary result)   Collection Time: 04/07/16 12:56 AM  Result Value Ref Range Status   Specimen Description BLOOD LEFT ANTECUBITAL  Final   Special Requests   Final    BOTTLES DRAWN AEROBIC AND ANAEROBIC AEB 10CC ANA 5CC   Culture NO GROWTH < 12 HOURS  Final   Report Status PENDING  Incomplete  Culture, blood (routine x 2)     Status: None (Preliminary result)   Collection Time: 04/07/16  1:06 AM  Result Value Ref Range Status   Specimen Description BLOOD LEFT HAND  Final   Special Requests BOTTLES DRAWN AEROBIC AND  ANAEROBIC 8CC EACH  Final   Culture NO GROWTH < 12 HOURS  Final   Report Status PENDING  Incomplete  MRSA PCR Screening  Status: None   Collection Time: 04/07/16  4:30 AM  Result Value Ref Range Status   MRSA by PCR NEGATIVE NEGATIVE Final    Comment:        The GeneXpert MRSA Assay (FDA approved for NASAL specimens only), is one component of a comprehensive MRSA colonization surveillance program. It is not intended to diagnose MRSA infection nor to guide or monitor treatment for MRSA infections.      Radiology Studies: Ct Head Wo Contrast  Result Date: 04/07/2016 CLINICAL DATA:  Altered mental status, intubation. Possible drug overdose. EXAM: CT HEAD WITHOUT CONTRAST TECHNIQUE: Contiguous axial images were obtained from the base of the skull through the vertex without intravenous contrast. COMPARISON:  None. FINDINGS: BRAIN: The ventricles and sulci are normal. No intraparenchymal hemorrhage, mass effect nor midline shift. No acute large vascular territory infarcts. No abnormal extra-axial fluid collections. Basal cisterns are patent. VASCULAR: Unremarkable. SKULL/SOFT TISSUES: No skull fracture. No significant soft tissue swelling. ORBITS/SINUSES: The included ocular globes and orbital contents are normal.Mild paranasal sinus mucosal thickening. Mastoid air cells are well aerated. OTHER: Fullness of the nasopharyngeal soft tissues, attributable to intubation. IMPRESSION: Normal CT HEAD. Electronically Signed   By: Elon Alas M.D.   On: 04/07/2016 00:38   Dg Chest Port 1 View  Result Date: 04/06/2016 CLINICAL DATA:  45 year old male with altered mental status. Status post intubation. EXAM: PORTABLE CHEST 1 VIEW COMPARISON:  None. FINDINGS: An endotracheal tube is noted with tip approximately 3.6 cm above the carina. An enteric tube courses into the left upper abdomen with tip under the left hemidiaphragm, likely in the gastric fundus. Mild diffuse interstitial coarsening. No  focal consolidation, pleural effusion, or pneumothorax. Top-normal cardiac size. No acute osseous pathology. IMPRESSION: Endotracheal tube above the carina and enteric tube with tip in the left upper abdomen likely in the stomach. Electronically Signed   By: Anner Crete M.D.   On: 04/06/2016 23:57    Scheduled Meds: . acyclovir  10 mg/kg Intravenous Q8H  . aspirin  300 mg Rectal Daily  . cefTRIAXone (ROCEPHIN)  IV  2 g Intravenous Q12H  . chlorhexidine gluconate (MEDLINE KIT)  15 mL Mouth Rinse BID  . docusate sodium  100 mg Oral BID  . famotidine (PEPCID) IV  20 mg Intravenous Q12H  . folic acid  1 mg Oral Daily  . insulin aspart  0-9 Units Subcutaneous Q4H  . levETIRAcetam  1,000 mg Intravenous Q12H  . mouth rinse  15 mL Mouth Rinse QID  . multivitamin with minerals  1 tablet Oral Daily  . sodium chloride  1,000 mL Intravenous Once  . sodium chloride flush  3 mL Intravenous Q12H  . thiamine  100 mg Oral Daily   Or  . thiamine  100 mg Intravenous Daily  . vancomycin  1,000 mg Intravenous Q8H   Continuous Infusions: . dextrose 5 % and 0.9 % NaCl with KCl 20 mEq/L 125 mL/hr at 04/08/16 0400  . fentaNYL infusion INTRAVENOUS 10 mcg/hr (04/07/16 1700)  . lactated ringers Stopped (04/07/16 1657)  . midazolam (VERSED) infusion 1 mg/hr (04/08/16 0504)     LOS: 1 day   Mickey Esguerra, MD FACP Hospitalist.   If 7PM-7AM, please contact night-coverage [email or web address] Password [email or web address] 04/08/2016, 7:26 AM

## 2016-04-08 NOTE — Progress Notes (Signed)
Wasted 40cc of Versed and 160cc of Fentanyl in sink with Alisia FerrariErica Spangler, RN

## 2016-04-08 NOTE — BH Assessment (Signed)
Clinician called to complete TTS consult. Clinician informed by pt RN Mitzi Davenport(Raimondi) that pt is asleep and difficult to arouse. Clinician requested that consult request be discontinued and resubmitted when pt able to participate.

## 2016-04-08 NOTE — Progress Notes (Signed)
EEG Completed; Results Pending  

## 2016-04-08 NOTE — Procedures (Signed)
**Note De-Identified Amanee Iacovelli Obfuscation** Extubation Procedure Note  Patient Details:   Name: Alejandro HeckBrian Conner DOB: 09-27-1970 MRN: [id number]   Airway Documentation:     Evaluation  O2 sats: stable throughout Complications: No apparent complications Patient did tolerate procedure well. Bilateral Breath Sounds: Diminished   Yes + leak, FVC and NIF WNL Elaysha Bevard, Megan SalonWendy Cooper 04/08/2016, 11:13 AM

## 2016-04-08 NOTE — Progress Notes (Signed)
Father came to nurses station and stated that patient told him that he tried to "kill himself".  He stated that he took his girlfriend's "drugs" but he is not able to identify them.  MD made aware. Patient placed on suicide precautions and telepsych consult ordered.

## 2016-04-08 NOTE — Progress Notes (Signed)
Spoke with radiologist about airborn precaution and policy precluded LP with exposure.  Given his clinical improvement, at least 24 hours of antibiotics, risk is quite low, and LP would be very helpful in treatment, will d/c airborn precaution.  Thanks,  Houston SirenPeter Kennadi Albany MD FACP. Hospitalist.

## 2016-04-08 NOTE — Progress Notes (Signed)
[place] A. Merlene Laughter, MD     [email or web address]          Alejandro Conner is an 45 y.o. male.   Assessment/Plan: 1. Unexplained encephalopathy. The patient was found down unresponsive which typically points to the significant intracranial at the mouth is possibly acute stroke, unwitnessed seizures or syncope/cardiopulmonary arrest. The patient is not febrile but appears to have evidence of acute UTI. He is being treated with broad-spectrum antibiotics which is appropriate. The patient has a improve rather dramatically and spontaneously. This type of semiology suggests possibly unwitnessed seizure. He is on Keppra and the this will be continued.        GENERAL: He is extubated.  HEENT: Supple neck.  ABDOMEN: soft  EXTREMITIES: No edema   BACK: Normal  SKIN: Normal by inspection.    MENTAL STATUS: Eyes are closed. However, he owns his eyes and is oriented to person, place year and month. He follows commands well.  CRANIAL NERVES: Pupils are equal, round and reactive to light; extra ocular movements are full;  upper and lower facial muscles are symmetric, there is no flattening of the nasolabial folds. Cornea reflexes are diminished bilaterally.  MOTOR: The patient has antigravity strength bilaterally in upper extremities to deep painful stem light. He withdraws appropriately.  COORDINATION: Left finger to nose is normal, right finger to nose is normal, No rest tremor; no intention tremor; no postural tremor; no bradykinesia.  REFLEXES: Deep tendon reflexes are symmetrical and normal.   SENSATION: Response to deep painful stimuli bilaterally.        Objective: Vital signs in last 24 hours: Temp:  [96.5 F (35.8 C)-98.2 F (36.8 C)] 97 F (36.1 C) (10/27 2017) Pulse Rate:  [81-90] 81 (10/27 1400) Resp:  [14-17] 14 (10/27 1400) BP: (106-119)/(63-77) 109/73 (10/27 1400) SpO2:  [99 %-100 %] 100 % (10/27 2004) FiO2 (%):  [40 %] 40 %  (10/27 0818) Weight:  [199 lb 1.2 oz (90.3 kg)] 199 lb 1.2 oz (90.3 kg) (10/27 0400)  Intake/Output from previous day: 10/26 0701 - 10/27 0700 In: 2129.2 [I.V.:1562.4; IV Piggyback:566.8] Out: 1200 [Urine:1200] Intake/Output this shift: No intake/output data recorded. Nutritional status: Diet regular Room service appropriate? Yes; Fluid consistency: Thin   Lab Results: Results for orders placed or performed during the hospital encounter of 04/06/16 (from the past 48 hour(s))  Comprehensive metabolic panel     Status: Abnormal   Collection Time: 04/06/16 11:29 PM  Result Value Ref Range   Sodium 138 135 - 145 mmol/L   Potassium 3.2 (L) 3.5 - 5.1 mmol/L   Chloride 106 101 - 111 mmol/L   CO2 21 (L) 22 - 32 mmol/L   Glucose, Bld 122 (H) 65 - 99 mg/dL   BUN 17 6 - 20 mg/dL   Creatinine, Ser 1.04 0.61 - 1.24 mg/dL   Calcium 9.2 8.9 - 10.3 mg/dL   Total Protein 7.3 6.5 - 8.1 g/dL   Albumin 4.5 3.5 - 5.0 g/dL   AST 63 (H) 15 - 41 U/L   ALT 34 17 - 63 U/L   Alkaline Phosphatase 63 38 - 126 U/L   Total Bilirubin 1.2 0.3 - 1.2 mg/dL   GFR calc non Af Amer >60 >60 mL/min   GFR calc Af Amer >60 >60 mL/min    Comment: (NOTE) The eGFR has been calculated using the CKD EPI equation. This calculation has not been validated in all clinical situations. eGFR's persistently <60 mL/min signify possible Chronic Kidney Disease.  Anion gap 11 5 - 15  Ethanol     Status: Abnormal   Collection Time: 04/06/16 11:29 PM  Result Value Ref Range   Alcohol, Ethyl (B) 8 (H) <5 mg/dL    Comment:        LOWEST DETECTABLE LIMIT FOR SERUM ALCOHOL IS 5 mg/dL FOR MEDICAL PURPOSES ONLY   Acetaminophen level     Status: Abnormal   Collection Time: 04/06/16 11:29 PM  Result Value Ref Range   Acetaminophen (Tylenol), Serum <10 (L) 10 - 30 ug/mL    Comment:        THERAPEUTIC CONCENTRATIONS VARY SIGNIFICANTLY. A RANGE OF 10-30 ug/mL MAY BE AN EFFECTIVE CONCENTRATION FOR MANY PATIENTS. HOWEVER, SOME  ARE BEST TREATED AT CONCENTRATIONS OUTSIDE THIS RANGE. ACETAMINOPHEN CONCENTRATIONS >150 ug/mL AT 4 HOURS AFTER INGESTION AND >50 ug/mL AT 12 HOURS AFTER INGESTION ARE OFTEN ASSOCIATED WITH TOXIC REACTIONS.   Salicylate level     Status: None   Collection Time: 04/06/16 11:29 PM  Result Value Ref Range   Salicylate Lvl <9.4 2.8 - 30.0 mg/dL  CBC with Differential     Status: None   Collection Time: 04/06/16 11:29 PM  Result Value Ref Range   WBC 8.7 4.0 - 10.5 K/uL   RBC 4.58 4.22 - 5.81 MIL/uL   Hemoglobin 14.3 13.0 - 17.0 g/dL   HCT 41.8 39.0 - 52.0 %   MCV 91.3 78.0 - 100.0 fL   MCH 31.2 26.0 - 34.0 pg   MCHC 34.2 30.0 - 36.0 g/dL   RDW 14.3 11.5 - 15.5 %   Platelets 193 150 - 400 K/uL   Neutrophils Relative % 70 %   Neutro Abs 6.0 1.7 - 7.7 K/uL   Lymphocytes Relative 20 %   Lymphs Abs 1.8 0.7 - 4.0 K/uL   Monocytes Relative 9 %   Monocytes Absolute 0.8 0.1 - 1.0 K/uL   Eosinophils Relative 1 %   Eosinophils Absolute 0.1 0.0 - 0.7 K/uL   Basophils Relative 0 %   Basophils Absolute 0.0 0.0 - 0.1 K/uL  Troponin I     Status: None   Collection Time: 04/06/16 11:29 PM  Result Value Ref Range   Troponin I <0.03 <0.03 ng/mL  Urine rapid drug screen (hosp performed)     Status: Abnormal   Collection Time: 04/06/16 11:50 PM  Result Value Ref Range   Opiates NONE DETECTED NONE DETECTED   Cocaine NONE DETECTED NONE DETECTED   Benzodiazepines NONE DETECTED NONE DETECTED   Amphetamines POSITIVE (A) NONE DETECTED   Tetrahydrocannabinol NONE DETECTED NONE DETECTED   Barbiturates NONE DETECTED NONE DETECTED    Comment:        DRUG SCREEN FOR MEDICAL PURPOSES ONLY.  IF CONFIRMATION IS NEEDED FOR ANY PURPOSE, NOTIFY LAB WITHIN 5 DAYS.        LOWEST DETECTABLE LIMITS FOR URINE DRUG SCREEN Drug Class       Cutoff (ng/mL) Amphetamine      1000 Barbiturate      200 Benzodiazepine   496 Tricyclics       759 Opiates          300 Cocaine          300 THC              50     Urinalysis, Routine w reflex microscopic     Status: Abnormal   Collection Time: 04/06/16 11:50 PM  Result Value Ref Range   Color, Urine YELLOW YELLOW  APPearance CLEAR CLEAR   Specific Gravity, Urine 1.010 1.005 - 1.030   pH 6.0 5.0 - 8.0   Glucose, UA NEGATIVE NEGATIVE mg/dL   Hgb urine dipstick LARGE (A) NEGATIVE   Bilirubin Urine LARGE (A) NEGATIVE   Ketones, ur TRACE (A) NEGATIVE mg/dL   Protein, ur NEGATIVE NEGATIVE mg/dL   Nitrite NEGATIVE NEGATIVE   Leukocytes, UA SMALL (A) NEGATIVE  Urine microscopic-add on     Status: Abnormal   Collection Time: 04/06/16 11:50 PM  Result Value Ref Range   Squamous Epithelial / LPF 0-5 (A) NONE SEEN   WBC, UA TOO NUMEROUS TO COUNT 0 - 5 WBC/hpf   RBC / HPF TOO NUMEROUS TO COUNT 0 - 5 RBC/hpf   Bacteria, UA MANY (A) NONE SEEN   Casts HYALINE CASTS (A) NEGATIVE    Comment: WBC CAST CELLULAR    Urine-Other MUCOUS PRESENT   Urine culture     Status: None   Collection Time: 04/06/16 11:50 PM  Result Value Ref Range   Specimen Description URINE, CATHETERIZED    Special Requests Normal    Culture NO GROWTH Performed at [facility]     Report Status 04/08/2016 FINAL   Blood gas, arterial ([facility] & [facility] ONLY)     Status: Abnormal   Collection Time: 04/07/16 12:19 AM  Result Value Ref Range   FIO2 100.00    Delivery systems VENTILATOR    Mode PRESSURE REGULATED VOLUME CONTROL    VT 600 mL   LHR 16.0 resp/min   Peep/cpap 5.0 cm H20   pH, Arterial 7.349 (L) 7.350 - 7.450   pCO2 arterial 37.8 32.0 - 48.0 mmHg   pO2, Arterial 414.0 (H) 83.0 - 108.0 mmHg   Bicarbonate 20.9 20.0 - 28.0 mmol/L   Acid-base deficit 4.4 (H) 0.0 - 2.0 mmol/L   O2 Saturation 99.5 %   Patient temperature 37.0    Collection site RIGHT RADIAL    Drawn by [id number]    Sample type ARTERIAL    Allens test (pass/fail) PASS PASS  Culture, blood (routine x 2)     Status: None (Preliminary result)   Collection Time: 04/07/16 12:56 AM  Result Value Ref Range    Specimen Description BLOOD LEFT ANTECUBITAL    Special Requests      BOTTLES DRAWN AEROBIC AND ANAEROBIC AEB 10CC ANA 5CC   Culture NO GROWTH 1 DAY    Report Status PENDING   Lactic acid, plasma     Status: None   Collection Time: 04/07/16 12:57 AM  Result Value Ref Range   Lactic Acid, Venous 0.8 0.5 - 1.9 mmol/L  Culture, blood (routine x 2)     Status: None (Preliminary result)   Collection Time: 04/07/16  1:06 AM  Result Value Ref Range   Specimen Description BLOOD LEFT HAND    Special Requests BOTTLES DRAWN AEROBIC AND ANAEROBIC [place]    Culture NO GROWTH 1 DAY    Report Status PENDING   MRSA PCR Screening     Status: None   Collection Time: 04/07/16  4:30 AM  Result Value Ref Range   MRSA by PCR NEGATIVE NEGATIVE    Comment:        The GeneXpert MRSA Assay (FDA approved for NASAL specimens only), is one component of a comprehensive MRSA colonization surveillance program. It is not intended to diagnose MRSA infection nor to guide or monitor treatment for MRSA infections.   Basic metabolic panel     Status:  Abnormal   Collection Time: 04/07/16  4:41 AM  Result Value Ref Range   Sodium 138 135 - 145 mmol/L   Potassium 3.1 (L) 3.5 - 5.1 mmol/L   Chloride 112 (H) 101 - 111 mmol/L   CO2 20 (L) 22 - 32 mmol/L   Glucose, Bld 96 65 - 99 mg/dL   BUN 16 6 - 20 mg/dL   Creatinine, Ser 0.95 0.61 - 1.24 mg/dL   Calcium 7.9 (L) 8.9 - 10.3 mg/dL   GFR calc non Af Amer >60 >60 mL/min   GFR calc Af Amer >60 >60 mL/min    Comment: (NOTE) The eGFR has been calculated using the CKD EPI equation. This calculation has not been validated in all clinical situations. eGFR's persistently <60 mL/min signify possible Chronic Kidney Disease.    Anion gap 6 5 - 15  CBC     Status: Abnormal   Collection Time: 04/07/16  4:41 AM  Result Value Ref Range   WBC 5.3 4.0 - 10.5 K/uL   RBC 4.00 (L) 4.22 - 5.81 MIL/uL   Hemoglobin 12.2 (L) 13.0 - 17.0 g/dL   HCT 36.4 (L) 39.0 - 52.0 %   MCV  91.0 78.0 - 100.0 fL   MCH 30.5 26.0 - 34.0 pg   MCHC 33.5 30.0 - 36.0 g/dL   RDW 14.5 11.5 - 15.5 %   Platelets 156 150 - 400 K/uL  Lactic acid, plasma     Status: None   Collection Time: 04/07/16  4:41 AM  Result Value Ref Range   Lactic Acid, Venous 0.7 0.5 - 1.9 mmol/L  Procalcitonin     Status: None   Collection Time: 04/07/16  4:41 AM  Result Value Ref Range   Procalcitonin <0.10 ng/mL    Comment:        Interpretation: PCT (Procalcitonin) <= 0.5 ng/mL: Systemic infection (sepsis) is not likely. Local bacterial infection is possible. (NOTE)         ICU PCT Algorithm               Non ICU PCT Algorithm    ----------------------------     ------------------------------         PCT < 0.25 ng/mL                 PCT < 0.1 ng/mL     Stopping of antibiotics            Stopping of antibiotics       strongly encouraged.               strongly encouraged.    ----------------------------     ------------------------------       PCT level decrease by               PCT < 0.25 ng/mL       >= 80% from peak PCT       OR PCT 0.25 - 0.5 ng/mL          Stopping of antibiotics                                             encouraged.     Stopping of antibiotics           encouraged.    ----------------------------     ------------------------------       PCT level decrease by  PCT >= 0.25 ng/mL       < 80% from peak PCT        AND PCT >= 0.5 ng/mL            Continuin g antibiotics                                              encouraged.       Continuing antibiotics            encouraged.    ----------------------------     ------------------------------     PCT level increase compared          PCT > 0.5 ng/mL         with peak PCT AND          PCT >= 0.5 ng/mL             Escalation of antibiotics                                          strongly encouraged.      Escalation of antibiotics        strongly encouraged.   Protime-INR     Status: None   Collection Time:  04/07/16  4:41 AM  Result Value Ref Range   Prothrombin Time 14.9 11.4 - 15.2 seconds   INR 1.16   APTT     Status: None   Collection Time: 04/07/16  4:41 AM  Result Value Ref Range   aPTT 32 24 - 36 seconds  Lactic acid, plasma     Status: None   Collection Time: 04/07/16  8:19 AM  Result Value Ref Range   Lactic Acid, Venous 1.0 0.5 - 1.9 mmol/L  Glucose, capillary     Status: None   Collection Time: 04/07/16  9:06 AM  Result Value Ref Range   Glucose-Capillary 94 65 - 99 mg/dL  HIV antibody     Status: None   Collection Time: 04/07/16  9:54 AM  Result Value Ref Range   HIV Screen 4th Generation wRfx Non Reactive Non Reactive    Comment: (NOTE) Performed At: [facility] [place], [place] [id number] Lindon Romp MD CL:[id number]   Vitamin B12     Status: None   Collection Time: 04/07/16  9:54 AM  Result Value Ref Range   Vitamin B-12 220 180 - 914 pg/mL    Comment: (NOTE) This assay is not validated for testing neonatal or myeloproliferative syndrome specimens for Vitamin B12 levels. Performed at [facility]   CK     Status: Abnormal   Collection Time: 04/07/16  9:54 AM  Result Value Ref Range   Total CK 856 (H) 49 - 397 U/L  Glucose, capillary     Status: Abnormal   Collection Time: 04/07/16 11:57 AM  Result Value Ref Range   Glucose-Capillary 104 (H) 65 - 99 mg/dL  Glucose, capillary     Status: Abnormal   Collection Time: 04/07/16  4:52 PM  Result Value Ref Range   Glucose-Capillary 100 (H) 65 - 99 mg/dL  Glucose, capillary     Status: Abnormal   Collection Time: 04/07/16  8:22 PM  Result Value Ref Range   Glucose-Capillary 113 (H) 65 -  99 mg/dL   Comment 1 Notify RN    Comment 2 Document in Chart   Glucose, capillary     Status: None   Collection Time: 04/08/16 12:14 AM  Result Value Ref Range   Glucose-Capillary 84 65 - 99 mg/dL   Comment 1 Notify RN    Comment 2 Document in Chart   Glucose, capillary     Status: None     Collection Time: 04/08/16  4:41 AM  Result Value Ref Range   Glucose-Capillary 84 65 - 99 mg/dL   Comment 1 Notify RN    Comment 2 Document in Chart   Basic metabolic panel     Status: Abnormal   Collection Time: 04/08/16  5:16 AM  Result Value Ref Range   Sodium 140 135 - 145 mmol/L   Potassium 3.2 (L) 3.5 - 5.1 mmol/L   Chloride 118 (H) 101 - 111 mmol/L   CO2 21 (L) 22 - 32 mmol/L   Glucose, Bld 107 (H) 65 - 99 mg/dL   BUN 10 6 - 20 mg/dL   Creatinine, Ser 1.06 0.61 - 1.24 mg/dL   Calcium 7.9 (L) 8.9 - 10.3 mg/dL   GFR calc non Af Amer >60 >60 mL/min   GFR calc Af Amer >60 >60 mL/min    Comment: (NOTE) The eGFR has been calculated using the CKD EPI equation. This calculation has not been validated in all clinical situations. eGFR's persistently <60 mL/min signify possible Chronic Kidney Disease.   Glucose, capillary     Status: None   Collection Time: 04/08/16  7:29 AM  Result Value Ref Range   Glucose-Capillary 87 65 - 99 mg/dL  Glucose, capillary     Status: None   Collection Time: 04/08/16 11:41 AM  Result Value Ref Range   Glucose-Capillary 79 65 - 99 mg/dL  Glucose, capillary     Status: None   Collection Time: 04/08/16  8:24 PM  Result Value Ref Range   Glucose-Capillary 81 65 - 99 mg/dL   Comment 1 Notify RN    Comment 2 Document in Chart     Lipid Panel No results for input(s): CHOL, TRIG, HDL, CHOLHDL, VLDL, LDLCALC in the last 72 hours.  Studies/Results:   Medications:  Scheduled Meds: . acyclovir  10 mg/kg Intravenous Q8H  . cefTRIAXone (ROCEPHIN)  IV  2 g Intravenous Q12H  . docusate sodium  100 mg Oral BID  . folic acid  1 mg Oral Daily  . levETIRAcetam  1,000 mg Intravenous Q12H  . multivitamin with minerals  1 tablet Oral Daily  . [START ON 04/09/2016] pantoprazole  40 mg Oral Q0600  . sodium chloride  1,000 mL Intravenous Once  . sodium chloride flush  3 mL Intravenous Q12H  . thiamine  100 mg Oral Daily   Or  . thiamine  100 mg  Intravenous Daily  . vancomycin  1,000 mg Intravenous Q8H   Continuous Infusions: . dextrose 5 % and 0.9 % NaCl with KCl 20 mEq/L 50 mL/hr at 04/08/16 1702   PRN Meds:.acetaminophen **OR** acetaminophen, LORazepam **OR** LORazepam, ondansetron **OR** ondansetron (ZOFRAN) IV     LOS: 1 day   Xylon Croom A. Merlene Laughter, M.D.  Diplomate, Tax adviser of Psychiatry and Neurology ( Neurology).

## 2016-04-08 NOTE — Procedures (Signed)
  [place] A. Merlene Laughter, MD     [email or web address]           HISTORY: This is a 45 year old presents with encephalopathy and seizures.  MEDICATIONS: Scheduled Meds: . acyclovir  10 mg/kg Intravenous Q8H  . aspirin  300 mg Rectal Daily  . cefTRIAXone (ROCEPHIN)  IV  2 g Intravenous Q12H  . chlorhexidine gluconate (MEDLINE KIT)  15 mL Mouth Rinse BID  . docusate sodium  100 mg Oral BID  . famotidine (PEPCID) IV  20 mg Intravenous Q12H  . folic acid  1 mg Oral Daily  . insulin aspart  0-9 Units Subcutaneous Q4H  . levETIRAcetam  1,000 mg Intravenous Q12H  . mouth rinse  15 mL Mouth Rinse QID  . multivitamin with minerals  1 tablet Oral Daily  . sodium chloride  1,000 mL Intravenous Once  . sodium chloride flush  3 mL Intravenous Q12H  . thiamine  100 mg Oral Daily   Or  . thiamine  100 mg Intravenous Daily  . vancomycin  1,000 mg Intravenous Q8H   Continuous Infusions: . dextrose 5 % and 0.9 % NaCl with KCl 20 mEq/L 125 mL/hr at 04/08/16 1306  . fentaNYL infusion INTRAVENOUS 10 mcg/hr (04/07/16 1700)  . lactated ringers Stopped (04/07/16 6433)  . midazolam (VERSED) infusion 1 mg/hr (04/08/16 0504)   PRN Meds:.acetaminophen **OR** acetaminophen, LORazepam **OR** LORazepam, ondansetron **OR** ondansetron (ZOFRAN) IV  Prior to Admission medications   Not on File      ANALYSIS: A 16 channel recording using standard 10 20 measurements is conducted for 20 minutes. There is a posterior dominant rhythm of maximum 8 Hz. There is beta activity observed in the frontal areas. There is significant spindles and K complexes observed throughout the recording. Photic stimulation and hyperventilation are not conducted. There is no focal or lateralized slowing. There is no epileptiform activity is observed.   IMPRESSION: There is mild global slowing. Otherwise, this is a unremarkable recording of the awake and sleep states.       Akirah Storck A. Merlene Laughter, M.D.    Diplomate, Tax adviser of Psychiatry and Neurology ( Neurology).

## 2016-04-08 NOTE — Progress Notes (Signed)
Spoke with Alejandro Conner, Infection Prevention Nurse regarding patient's dried shingles on chest. IP RN stated that contact precautions could be d/c'd since shingles were scabbed over.

## 2016-04-08 NOTE — Progress Notes (Signed)
Subjective: He remains intubated and on the ventilator but is doing better and more responsive. He is following commands. His nurse last night noted that he was diaphoretic with no fever. He did not have lumbar puncture yesterday but that's planned for today.  Objective: Vital signs in last 24 hours: Temp:  [94.6 F (34.8 C)-98.1 F (36.7 C)] 97.9 F (36.6 C) (10/27 0400) Pulse Rate:  [72-103] 84 (10/26 2300) Resp:  [16-29] 16 (10/26 2300) BP: (106-141)/(59-97) 106/67 (10/27 0600) SpO2:  [100 %] 100 % (10/26 2300) FiO2 (%):  [40 %] 40 % (10/27 0400) Weight:  [90.3 kg (199 lb 1.2 oz)] 90.3 kg (199 lb 1.2 oz) (10/27 0400) Weight change: 6.4 kg (14 lb 1.8 oz)    Intake/Output from previous day: 10/26 0701 - 10/27 0700 In: 2129.2 [I.V.:1562.4; IV Piggyback:566.8] Out: 1200 [Urine:1200]  PHYSICAL EXAM General appearance: Intubated sedated on mechanical ventilation more responsive Resp: clear to auscultation bilaterally Cardio: regular rate and rhythm, S1, S2 normal, no murmur, click, rub or gallop GI: soft, non-tender; bowel sounds normal; no masses,  no organomegaly Extremities: extremities normal, atraumatic, no cyanosis or edema Skin now warm and dry.  Lab Results:  Results for orders placed or performed during the hospital encounter of 04/06/16 (from the past 48 hour(s))  Comprehensive metabolic panel     Status: Abnormal   Collection Time: 04/06/16 11:29 PM  Result Value Ref Range   Sodium 138 135 - 145 mmol/L   Potassium 3.2 (L) 3.5 - 5.1 mmol/L   Chloride 106 101 - 111 mmol/L   CO2 21 (L) 22 - 32 mmol/L   Glucose, Bld 122 (H) 65 - 99 mg/dL   BUN 17 6 - 20 mg/dL   Creatinine, Ser 1.04 0.61 - 1.24 mg/dL   Calcium 9.2 8.9 - 10.3 mg/dL   Total Protein 7.3 6.5 - 8.1 g/dL   Albumin 4.5 3.5 - 5.0 g/dL   AST 63 (H) 15 - 41 U/L   ALT 34 17 - 63 U/L   Alkaline Phosphatase 63 38 - 126 U/L   Total Bilirubin 1.2 0.3 - 1.2 mg/dL   GFR calc non Af Amer >60 >60 mL/min   GFR calc  Af Amer >60 >60 mL/min    Comment: (NOTE) The eGFR has been calculated using the CKD EPI equation. This calculation has not been validated in all clinical situations. eGFR's persistently <60 mL/min signify possible Chronic Kidney Disease.    Anion gap 11 5 - 15  Ethanol     Status: Abnormal   Collection Time: 04/06/16 11:29 PM  Result Value Ref Range   Alcohol, Ethyl (B) 8 (H) <5 mg/dL    Comment:        LOWEST DETECTABLE LIMIT FOR SERUM ALCOHOL IS 5 mg/dL FOR MEDICAL PURPOSES ONLY   Acetaminophen level     Status: Abnormal   Collection Time: 04/06/16 11:29 PM  Result Value Ref Range   Acetaminophen (Tylenol), Serum <10 (L) 10 - 30 ug/mL    Comment:        THERAPEUTIC CONCENTRATIONS VARY SIGNIFICANTLY. A RANGE OF 10-30 ug/mL MAY BE AN EFFECTIVE CONCENTRATION FOR MANY PATIENTS. HOWEVER, SOME ARE BEST TREATED AT CONCENTRATIONS OUTSIDE THIS RANGE. ACETAMINOPHEN CONCENTRATIONS >150 ug/mL AT 4 HOURS AFTER INGESTION AND >50 ug/mL AT 12 HOURS AFTER INGESTION ARE OFTEN ASSOCIATED WITH TOXIC REACTIONS.   Salicylate level     Status: None   Collection Time: 04/06/16 11:29 PM  Result Value Ref Range   Salicylate Lvl <  7.0 2.8 - 30.0 mg/dL  CBC with Differential     Status: None   Collection Time: 04/06/16 11:29 PM  Result Value Ref Range   WBC 8.7 4.0 - 10.5 K/uL   RBC 4.58 4.22 - 5.81 MIL/uL   Hemoglobin 14.3 13.0 - 17.0 g/dL   HCT 41.8 39.0 - 52.0 %   MCV 91.3 78.0 - 100.0 fL   MCH 31.2 26.0 - 34.0 pg   MCHC 34.2 30.0 - 36.0 g/dL   RDW 14.3 11.5 - 15.5 %   Platelets 193 150 - 400 K/uL   Neutrophils Relative % 70 %   Neutro Abs 6.0 1.7 - 7.7 K/uL   Lymphocytes Relative 20 %   Lymphs Abs 1.8 0.7 - 4.0 K/uL   Monocytes Relative 9 %   Monocytes Absolute 0.8 0.1 - 1.0 K/uL   Eosinophils Relative 1 %   Eosinophils Absolute 0.1 0.0 - 0.7 K/uL   Basophils Relative 0 %   Basophils Absolute 0.0 0.0 - 0.1 K/uL  Troponin I     Status: None   Collection Time: 04/06/16 11:29 PM   Result Value Ref Range   Troponin I <0.03 <0.03 ng/mL  Urine rapid drug screen (hosp performed)     Status: Abnormal   Collection Time: 04/06/16 11:50 PM  Result Value Ref Range   Opiates NONE DETECTED NONE DETECTED   Cocaine NONE DETECTED NONE DETECTED   Benzodiazepines NONE DETECTED NONE DETECTED   Amphetamines POSITIVE (A) NONE DETECTED   Tetrahydrocannabinol NONE DETECTED NONE DETECTED   Barbiturates NONE DETECTED NONE DETECTED    Comment:        DRUG SCREEN FOR MEDICAL PURPOSES ONLY.  IF CONFIRMATION IS NEEDED FOR ANY PURPOSE, NOTIFY LAB WITHIN 5 DAYS.        LOWEST DETECTABLE LIMITS FOR URINE DRUG SCREEN Drug Class       Cutoff (ng/mL) Amphetamine      1000 Barbiturate      200 Benzodiazepine   768 Tricyclics       115 Opiates          300 Cocaine          300 THC              50   Urinalysis, Routine w reflex microscopic     Status: Abnormal   Collection Time: 04/06/16 11:50 PM  Result Value Ref Range   Color, Urine YELLOW YELLOW   APPearance CLEAR CLEAR   Specific Gravity, Urine 1.010 1.005 - 1.030   pH 6.0 5.0 - 8.0   Glucose, UA NEGATIVE NEGATIVE mg/dL   Hgb urine dipstick LARGE (A) NEGATIVE   Bilirubin Urine LARGE (A) NEGATIVE   Ketones, ur TRACE (A) NEGATIVE mg/dL   Protein, ur NEGATIVE NEGATIVE mg/dL   Nitrite NEGATIVE NEGATIVE   Leukocytes, UA SMALL (A) NEGATIVE  Urine microscopic-add on     Status: Abnormal   Collection Time: 04/06/16 11:50 PM  Result Value Ref Range   Squamous Epithelial / LPF 0-5 (A) NONE SEEN   WBC, UA TOO NUMEROUS TO COUNT 0 - 5 WBC/hpf   RBC / HPF TOO NUMEROUS TO COUNT 0 - 5 RBC/hpf   Bacteria, UA MANY (A) NONE SEEN   Casts HYALINE CASTS (A) NEGATIVE    Comment: WBC CAST CELLULAR    Urine-Other MUCOUS PRESENT   Blood gas, arterial ([facility] & [facility] ONLY)     Status: Abnormal   Collection Time: 04/07/16 12:19 AM  Result Value Ref  Range   FIO2 100.00    Delivery systems VENTILATOR    Mode PRESSURE REGULATED VOLUME CONTROL    VT  600 mL   LHR 16.0 resp/min   Peep/cpap 5.0 cm H20   pH, Arterial 7.349 (L) 7.350 - 7.450   pCO2 arterial 37.8 32.0 - 48.0 mmHg   pO2, Arterial 414.0 (H) 83.0 - 108.0 mmHg   Bicarbonate 20.9 20.0 - 28.0 mmol/L   Acid-base deficit 4.4 (H) 0.0 - 2.0 mmol/L   O2 Saturation 99.5 %   Patient temperature 37.0    Collection site RIGHT RADIAL    Drawn by [id number]    Sample type ARTERIAL    Allens test (pass/fail) PASS PASS  Culture, blood (routine x 2)     Status: None (Preliminary result)   Collection Time: 04/07/16 12:56 AM  Result Value Ref Range   Specimen Description BLOOD LEFT ANTECUBITAL    Special Requests      BOTTLES DRAWN AEROBIC AND ANAEROBIC AEB 10CC ANA 5CC   Culture NO GROWTH < 12 HOURS    Report Status PENDING   Lactic acid, plasma     Status: None   Collection Time: 04/07/16 12:57 AM  Result Value Ref Range   Lactic Acid, Venous 0.8 0.5 - 1.9 mmol/L  Culture, blood (routine x 2)     Status: None (Preliminary result)   Collection Time: 04/07/16  1:06 AM  Result Value Ref Range   Specimen Description BLOOD LEFT HAND    Special Requests BOTTLES DRAWN AEROBIC AND ANAEROBIC 8CC EACH    Culture NO GROWTH < 12 HOURS    Report Status PENDING   MRSA PCR Screening     Status: None   Collection Time: 04/07/16  4:30 AM  Result Value Ref Range   MRSA by PCR NEGATIVE NEGATIVE    Comment:        The GeneXpert MRSA Assay (FDA approved for NASAL specimens only), is one component of a comprehensive MRSA colonization surveillance program. It is not intended to diagnose MRSA infection nor to guide or monitor treatment for MRSA infections.   Basic metabolic panel     Status: Abnormal   Collection Time: 04/07/16  4:41 AM  Result Value Ref Range   Sodium 138 135 - 145 mmol/L   Potassium 3.1 (L) 3.5 - 5.1 mmol/L   Chloride 112 (H) 101 - 111 mmol/L   CO2 20 (L) 22 - 32 mmol/L   Glucose, Bld 96 65 - 99 mg/dL   BUN 16 6 - 20 mg/dL   Creatinine, Ser 0.95 0.61 - 1.24 mg/dL   Calcium  7.9 (L) 8.9 - 10.3 mg/dL   GFR calc non Af Amer >60 >60 mL/min   GFR calc Af Amer >60 >60 mL/min    Comment: (NOTE) The eGFR has been calculated using the CKD EPI equation. This calculation has not been validated in all clinical situations. eGFR's persistently <60 mL/min signify possible Chronic Kidney Disease.    Anion gap 6 5 - 15  CBC     Status: Abnormal   Collection Time: 04/07/16  4:41 AM  Result Value Ref Range   WBC 5.3 4.0 - 10.5 K/uL   RBC 4.00 (L) 4.22 - 5.81 MIL/uL   Hemoglobin 12.2 (L) 13.0 - 17.0 g/dL   HCT 36.4 (L) 39.0 - 52.0 %   MCV 91.0 78.0 - 100.0 fL   MCH 30.5 26.0 - 34.0 pg   MCHC 33.5 30.0 - 36.0 g/dL   RDW  14.5 11.5 - 15.5 %   Platelets 156 150 - 400 K/uL  Lactic acid, plasma     Status: None   Collection Time: 04/07/16  4:41 AM  Result Value Ref Range   Lactic Acid, Venous 0.7 0.5 - 1.9 mmol/L  Procalcitonin     Status: None   Collection Time: 04/07/16  4:41 AM  Result Value Ref Range   Procalcitonin <0.10 ng/mL    Comment:        Interpretation: PCT (Procalcitonin) <= 0.5 ng/mL: Systemic infection (sepsis) is not likely. Local bacterial infection is possible. (NOTE)         ICU PCT Algorithm               Non ICU PCT Algorithm    ----------------------------     ------------------------------         PCT < 0.25 ng/mL                 PCT < 0.1 ng/mL     Stopping of antibiotics            Stopping of antibiotics       strongly encouraged.               strongly encouraged.    ----------------------------     ------------------------------       PCT level decrease by               PCT < 0.25 ng/mL       >= 80% from peak PCT       OR PCT 0.25 - 0.5 ng/mL          Stopping of antibiotics                                             encouraged.     Stopping of antibiotics           encouraged.    ----------------------------     ------------------------------       PCT level decrease by              PCT >= 0.25 ng/mL       < 80% from peak PCT         AND PCT >= 0.5 ng/mL            Continuin g antibiotics                                              encouraged.       Continuing antibiotics            encouraged.    ----------------------------     ------------------------------     PCT level increase compared          PCT > 0.5 ng/mL         with peak PCT AND          PCT >= 0.5 ng/mL             Escalation of antibiotics  strongly encouraged.      Escalation of antibiotics        strongly encouraged.   Protime-INR     Status: None   Collection Time: 04/07/16  4:41 AM  Result Value Ref Range   Prothrombin Time 14.9 11.4 - 15.2 seconds   INR 1.16   APTT     Status: None   Collection Time: 04/07/16  4:41 AM  Result Value Ref Range   aPTT 32 24 - 36 seconds  Lactic acid, plasma     Status: None   Collection Time: 04/07/16  8:19 AM  Result Value Ref Range   Lactic Acid, Venous 1.0 0.5 - 1.9 mmol/L  Glucose, capillary     Status: None   Collection Time: 04/07/16  9:06 AM  Result Value Ref Range   Glucose-Capillary 94 65 - 99 mg/dL  Vitamin B12     Status: None   Collection Time: 04/07/16  9:54 AM  Result Value Ref Range   Vitamin B-12 220 180 - 914 pg/mL    Comment: (NOTE) This assay is not validated for testing neonatal or myeloproliferative syndrome specimens for Vitamin B12 levels. Performed at [facility]   CK     Status: Abnormal   Collection Time: 04/07/16  9:54 AM  Result Value Ref Range   Total CK 856 (H) 49 - 397 U/L  Glucose, capillary     Status: Abnormal   Collection Time: 04/07/16 11:57 AM  Result Value Ref Range   Glucose-Capillary 104 (H) 65 - 99 mg/dL  Glucose, capillary     Status: Abnormal   Collection Time: 04/07/16  4:52 PM  Result Value Ref Range   Glucose-Capillary 100 (H) 65 - 99 mg/dL  Glucose, capillary     Status: Abnormal   Collection Time: 04/07/16  8:22 PM  Result Value Ref Range   Glucose-Capillary 113 (H) 65 - 99 mg/dL   Comment 1  Notify RN    Comment 2 Document in Chart   Glucose, capillary     Status: None   Collection Time: 04/08/16 12:14 AM  Result Value Ref Range   Glucose-Capillary 84 65 - 99 mg/dL   Comment 1 Notify RN    Comment 2 Document in Chart   Glucose, capillary     Status: None   Collection Time: 04/08/16  4:41 AM  Result Value Ref Range   Glucose-Capillary 84 65 - 99 mg/dL   Comment 1 Notify RN    Comment 2 Document in Chart   Basic metabolic panel     Status: Abnormal   Collection Time: 04/08/16  5:16 AM  Result Value Ref Range   Sodium 140 135 - 145 mmol/L   Potassium 3.2 (L) 3.5 - 5.1 mmol/L   Chloride 118 (H) 101 - 111 mmol/L   CO2 21 (L) 22 - 32 mmol/L   Glucose, Bld 107 (H) 65 - 99 mg/dL   BUN 10 6 - 20 mg/dL   Creatinine, Ser 1.06 0.61 - 1.24 mg/dL   Calcium 7.9 (L) 8.9 - 10.3 mg/dL   GFR calc non Af Amer >60 >60 mL/min   GFR calc Af Amer >60 >60 mL/min    Comment: (NOTE) The eGFR has been calculated using the CKD EPI equation. This calculation has not been validated in all clinical situations. eGFR's persistently <60 mL/min signify possible Chronic Kidney Disease.   Glucose, capillary     Status: None   Collection Time: 04/08/16  7:29 AM  Result  Value Ref Range   Glucose-Capillary 87 65 - 99 mg/dL    ABGS  Recent Labs  04/07/16 0019  PHART 7.349*  PO2ART 414.0*  HCO3 20.9   CULTURES Recent Results (from the past 240 hour(s))  Culture, blood (routine x 2)     Status: None (Preliminary result)   Collection Time: 04/07/16 12:56 AM  Result Value Ref Range Status   Specimen Description BLOOD LEFT ANTECUBITAL  Final   Special Requests   Final    BOTTLES DRAWN AEROBIC AND ANAEROBIC AEB 10CC ANA 5CC   Culture NO GROWTH < 12 HOURS  Final   Report Status PENDING  Incomplete  Culture, blood (routine x 2)     Status: None (Preliminary result)   Collection Time: 04/07/16  1:06 AM  Result Value Ref Range Status   Specimen Description BLOOD LEFT HAND  Final   Special  Requests BOTTLES DRAWN AEROBIC AND ANAEROBIC 8CC EACH  Final   Culture NO GROWTH < 12 HOURS  Final   Report Status PENDING  Incomplete  MRSA PCR Screening     Status: None   Collection Time: 04/07/16  4:30 AM  Result Value Ref Range Status   MRSA by PCR NEGATIVE NEGATIVE Final    Comment:        The GeneXpert MRSA Assay (FDA approved for NASAL specimens only), is one component of a comprehensive MRSA colonization surveillance program. It is not intended to diagnose MRSA infection nor to guide or monitor treatment for MRSA infections.    Studies/Results: Ct Head Wo Contrast  Result Date: 04/07/2016 CLINICAL DATA:  Altered mental status, intubation. Possible drug overdose. EXAM: CT HEAD WITHOUT CONTRAST TECHNIQUE: Contiguous axial images were obtained from the base of the skull through the vertex without intravenous contrast. COMPARISON:  None. FINDINGS: BRAIN: The ventricles and sulci are normal. No intraparenchymal hemorrhage, mass effect nor midline shift. No acute large vascular territory infarcts. No abnormal extra-axial fluid collections. Basal cisterns are patent. VASCULAR: Unremarkable. SKULL/SOFT TISSUES: No skull fracture. No significant soft tissue swelling. ORBITS/SINUSES: The included ocular globes and orbital contents are normal.Mild paranasal sinus mucosal thickening. Mastoid air cells are well aerated. OTHER: Fullness of the nasopharyngeal soft tissues, attributable to intubation. IMPRESSION: Normal CT HEAD. Electronically Signed   By: Elon Alas M.D.   On: 04/07/2016 00:38   Dg Chest Port 1 View  Result Date: 04/06/2016 CLINICAL DATA:  45 year old male with altered mental status. Status post intubation. EXAM: PORTABLE CHEST 1 VIEW COMPARISON:  None. FINDINGS: An endotracheal tube is noted with tip approximately 3.6 cm above the carina. An enteric tube courses into the left upper abdomen with tip under the left hemidiaphragm, likely in the gastric fundus. Mild diffuse  interstitial coarsening. No focal consolidation, pleural effusion, or pneumothorax. Top-normal cardiac size. No acute osseous pathology. IMPRESSION: Endotracheal tube above the carina and enteric tube with tip in the left upper abdomen likely in the stomach. Electronically Signed   By: Anner Crete M.D.   On: 04/06/2016 23:57    Medications:  Prior to Admission:  No prescriptions prior to admission.   Scheduled: . acyclovir  10 mg/kg Intravenous Q8H  . aspirin  300 mg Rectal Daily  . cefTRIAXone (ROCEPHIN)  IV  2 g Intravenous Q12H  . chlorhexidine gluconate (MEDLINE KIT)  15 mL Mouth Rinse BID  . docusate sodium  100 mg Oral BID  . famotidine (PEPCID) IV  20 mg Intravenous Q12H  . folic acid  1 mg Oral Daily  .  insulin aspart  0-9 Units Subcutaneous Q4H  . levETIRAcetam  1,000 mg Intravenous Q12H  . mouth rinse  15 mL Mouth Rinse QID  . multivitamin with minerals  1 tablet Oral Daily  . sodium chloride  1,000 mL Intravenous Once  . sodium chloride flush  3 mL Intravenous Q12H  . thiamine  100 mg Oral Daily   Or  . thiamine  100 mg Intravenous Daily  . vancomycin  1,000 mg Intravenous Q8H   Continuous: . dextrose 5 % and 0.9 % NaCl with KCl 20 mEq/L 125 mL/hr at 04/08/16 0400  . fentaNYL infusion INTRAVENOUS 10 mcg/hr (04/07/16 1700)  . lactated ringers Stopped (04/07/16 5462)  . midazolam (VERSED) infusion 1 mg/hr (04/08/16 0504)   VOJ:[id number] **OR** acetaminophen, LORazepam **OR** LORazepam, ondansetron **OR** ondansetron (ZOFRAN) IV  Assesment: He was admitted with acute encephalopathy and is not really clear what that is from. Multiple possibilities including alcohol withdrawal infections including herpetic or bacterial. He is better. He was intubated for airway protection but does have a significant smoking history so he may have some element of COPD as well. Principal Problem:   Altered mental status Active Problems:   Hypokalemia    Plan: Lumbar puncture  today and depending on the results of that he may be able to have his antibiotics significantly decreased. Attempt weaning and extubation today. Continue treatments otherwise. Discussed with Dr. Marin Comment.    LOS: 1 day   Gaby Harney L 04/08/2016, 7:49 AM

## 2016-04-09 ENCOUNTER — Inpatient Hospital Stay (HOSPITAL_COMMUNITY): Payer: Self-pay

## 2016-04-09 DIAGNOSIS — Z79899 Other long term (current) drug therapy: Secondary | ICD-10-CM

## 2016-04-09 DIAGNOSIS — R400 Somnolence: Secondary | ICD-10-CM

## 2016-04-09 DIAGNOSIS — R4182 Altered mental status, unspecified: Secondary | ICD-10-CM

## 2016-04-09 DIAGNOSIS — F17210 Nicotine dependence, cigarettes, uncomplicated: Secondary | ICD-10-CM

## 2016-04-09 LAB — VANCOMYCIN, TROUGH: Vancomycin Tr: 21 ug/mL (ref 15–20)

## 2016-04-09 MED ORDER — POTASSIUM CHLORIDE CRYS ER 20 MEQ PO TBCR
20.0000 meq | EXTENDED_RELEASE_TABLET | Freq: Every day | ORAL | Status: DC
  Administered 2016-04-09 – 2016-04-11 (×3): 20 meq via ORAL
  Filled 2016-04-09 (×3): qty 1

## 2016-04-09 MED ORDER — NICOTINE 14 MG/24HR TD PT24
14.0000 mg | MEDICATED_PATCH | Freq: Every day | TRANSDERMAL | Status: DC
  Administered 2016-04-09 – 2016-04-10 (×2): 14 mg via TRANSDERMAL
  Filled 2016-04-09 (×3): qty 1

## 2016-04-09 MED ORDER — VANCOMYCIN HCL IN DEXTROSE 750-5 MG/150ML-% IV SOLN
750.0000 mg | Freq: Three times a day (TID) | INTRAVENOUS | Status: DC
  Administered 2016-04-09 – 2016-04-11 (×5): 750 mg via INTRAVENOUS
  Filled 2016-04-09 (×9): qty 150

## 2016-04-09 NOTE — Progress Notes (Signed)
PROGRESS NOTE    Alejandro HeckBrian Seabolt  WUJ:[id number] DOB: [birth year] DOA: 04/06/2016 PCP: No PCP Per Patient    Brief Narrative: Patient was found unconscious, after a "cold" a week prior, had a seizure in the ED, intubated for airway protection, LP attempted failed, LP held as he had one dose of Lovenox yesterday, Tx with IV Van/Rocephin 2gQ12/ Acyclovir and maintained intubated.  Work up showed amphetamine and trace alcohol level.  He was found to also have UTI.  He was started on CIWA, and IV Keppra was given.  Neurology was consulted, and recommend continuation of Tx. Appreciate PCCM's help as well.  He was subsequently extubated successfully, and was able to say he was suicidal and overdosed on his fiancee drug and some others, unclear what they were.  His airborn precaution was lifted, and he was taken to IR for floroscopic LP, but he refused.  Sitter was placed, and psych initial eval was done, but he was too lethargic.  He is better now, and able to eat himself.     Assessment & Plan:   1. Acute encephalopathy:  Still unclear, but it appears at the least, he was attempted suicide.  Though he denied any drinking, he has positive ethyl alcohol in his system. At this point, given uncertainty and his refusal to permit LP, I recommended completing his antibiotics and antiviral at this time.  He is better today, clearly, so it is encouraging.  I can transfer him to Med Surg today, and will continue to fully evaluate with psychiatry.   2. Seizure:  Unclear if metabolic derrangement, primary, or alcohol withdrawal.  Will d/c Keppra today. 3. 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  Will obtain renal US to exclude hydroneprhrosis.   DVT prophylaxis:  Lovenox.  Code Status: FULL CODE.  Family Communication: None today.  Disposition Plan: to home when appropriate.   Consultants:   Neuro  PCCM.     Procedures:   Intubation  Spinal tap.   Antimicrobials: Anti-infectives    Start     Dose/Rate Route Frequency Ordered Stop   04/07/16 1800  cefTRIAXone (ROCEPHIN) 2 g in dextrose 5 % 50 mL IVPB     2 g 100 mL/hr over 30 Minutes Intravenous Every 12 hours 04/07/16 0734     04/07/16 1300  vancomycin (VANCOCIN) IVPB 1000 mg/200 mL premix     1,000 mg 200 mL/hr over 60 Minutes Intravenous Every 8 hours 04/07/16 0735     04/07/16 0430  acyclovir (ZOVIRAX) 840 mg in dextrose 5 % 150 mL IVPB     10 mg/kg  83.9 kg 166.8 mL/hr over 60 Minutes Intravenous Every 8 hours 04/07/16 0407     04/07/16 0430  vancomycin (VANCOCIN) IVPB 1000 mg/200 mL premix     1,000 mg 200 mL/hr over 60 Minutes Intravenous  Once 04/07/16 0428 04/07/16 0551   04/07/16 0415  cefTRIAXone (ROCEPHIN) 1 g in dextrose 5 % 50 mL IVPB     1 g 100 mL/hr over 30 Minutes Intravenous NOW 04/07/16 0402 04/07/16 0625   04/07/16 0045  cefTRIAXone (ROCEPHIN) 1 g in dextrose 5 % 50 mL IVPB     1 g 100 mL/hr over 30 Minutes Intravenous  Once 04/07/16 0044 04/07/16 0143      Subjective: I am hungry.   Objective: Vitals:   04/09/16 0200 04/09/16 0300 04/09/16 0400  04/09/16 0500  BP: 117/79 117/88 116/82 129/88  Pulse: 64 63 67 63  Resp: 12 12 13 13   Temp:   97 F (36.1 C)   TempSrc:   Oral   SpO2: 100% 100% 100% 100%  Weight:   87.1 kg (192 lb 0.3 oz)   Height:        Intake/Output Summary (Last 24 hours) at 04/09/16 0630 Last data filed at 04/09/16 0425  Gross per 24 hour  Intake           1743.4 ml  Output             4725 ml  Net          -2981.6 ml   Filed Weights   04/07/16 0415 04/08/16 0400 04/09/16 0400  Weight: 83.9 kg (184 lb 15.5 oz) 90.3 kg (199 lb 1.2 oz) 87.1 kg (192 lb 0.3 oz)    Examination:   General exam: Appears calm and comfortable  Respiratory system: Clear to auscultation. Respiratory effort normal. Cardiovascular system: S1 & S2 heard, RRR. No JVD, murmurs, rubs, gallops or  clicks. No pedal edema. Gastrointestinal system: Abdomen is nondistended, soft and nontender. No organomegaly or masses felt. Normal bowel sounds heard. Central nervous system: Alert and oriented. No focal neurological deficits. Extremities: Symmetric 5 x 5 power. Skin: No rashes, lesions or ulcers Psychiatry: Judgement and insight appear normal. Mood & affect appropriate.   Data Reviewed: I have personally reviewed following labs and imaging studies  CBC:  Recent Labs Lab 04/06/16 2329 04/07/16 0441  WBC 8.7 5.3  NEUTROABS 6.0  --   HGB 14.3 12.2*  HCT 41.8 36.4*  MCV 91.3 91.0  PLT 193 156   Basic Metabolic Panel:  Recent Labs Lab 04/06/16 2329 04/07/16 0441 04/08/16 0516  NA 138 138 140  K 3.2* 3.1* 3.2*  CL 106 112* 118*  CO2 21* 20* 21*  GLUCOSE 122* 96 107*  BUN 17 16 10   CREATININE 1.04 0.95 1.06  CALCIUM 9.2 7.9* 7.9*   GFR: Estimated Creatinine Clearance: 100.2 mL/min (by C-G formula based on SCr of 1.06 mg/dL). Liver Function Tests:  Recent Labs Lab 04/06/16 2329  AST 63*  ALT 34  ALKPHOS 63  BILITOT 1.2  PROT 7.3  ALBUMIN 4.5   Coagulation Profile:  Recent Labs Lab 04/07/16 0441  INR 1.16   Cardiac Enzymes:  Recent Labs Lab 04/06/16 2329 04/07/16 0954  CKTOTAL  --  856*  TROPONINI <0.03  --    CBG:  Recent Labs Lab 04/08/16 0014 04/08/16 0441 04/08/16 0729 04/08/16 1141 04/08/16 2024  GLUCAP 84 84 87 79 81    Recent Labs  04/07/16 0954  VITAMINB12 220   Sepsis Labs:  Recent Labs Lab 04/07/16 0057 04/07/16 0441 04/07/16 0819  PROCALCITON  --  <0.10  --   LATICACIDVEN 0.8 0.7 1.0    Recent Results (from the past 240 hour(s))  Urine culture     Status: None   Collection Time: 04/06/16 11:50 PM  Result Value Ref Range Status   Specimen Description URINE, CATHETERIZED  Final   Special Requests Normal  Final   Culture NO GROWTH Performed at [facility]   Final   Report Status 04/08/2016 FINAL  Final   Culture, blood (routine x 2)     Status: None (Preliminary result)   Collection Time: 04/07/16 12:56 AM  Result Value Ref Range Status   Specimen Description BLOOD LEFT ANTECUBITAL  Final   Special Requests  Final    BOTTLES DRAWN AEROBIC AND ANAEROBIC AEB 10CC ANA 5CC   Culture NO GROWTH 1 DAY  Final   Report Status PENDING  Incomplete  Culture, blood (routine x 2)     Status: None (Preliminary result)   Collection Time: 04/07/16  1:06 AM  Result Value Ref Range Status   Specimen Description BLOOD LEFT HAND  Final   Special Requests BOTTLES DRAWN AEROBIC AND ANAEROBIC 8CC EACH  Final   Culture NO GROWTH 1 DAY  Final   Report Status PENDING  Incomplete  MRSA PCR Screening     Status: None   Collection Time: 04/07/16  4:30 AM  Result Value Ref Range Status   MRSA by PCR NEGATIVE NEGATIVE Final    Comment:        The GeneXpert MRSA Assay (FDA approved for NASAL specimens only), is one component of a comprehensive MRSA colonization surveillance program. It is not intended to diagnose MRSA infection nor to guide or monitor treatment for MRSA infections.      Radiology Studies: No results found.  Scheduled Meds: . acyclovir  10 mg/kg Intravenous Q8H  . cefTRIAXone (ROCEPHIN)  IV  2 g Intravenous Q12H  . docusate sodium  100 mg Oral BID  . folic acid  1 mg Oral Daily  . levETIRAcetam  1,000 mg Intravenous Q12H  . multivitamin with minerals  1 tablet Oral Daily  . pantoprazole  40 mg Oral Q0600  . sodium chloride  1,000 mL Intravenous Once  . sodium chloride flush  3 mL Intravenous Q12H  . thiamine  100 mg Oral Daily   Or  . thiamine  100 mg Intravenous Daily  . vancomycin  1,000 mg Intravenous Q8H   Continuous Infusions: . dextrose 5 % and 0.9 % NaCl with KCl 20 mEq/L 50 mL/hr at 04/08/16 1702     LOS: 2 days   Houston SirenPeter Chevon Laufer, MD [facility] Center[facility]ACP Hospitalist.    By signing my name below, I, Bobbie Stackhristopher Reid, attest that this documentation has been prepared under the direction  and in the presence of Houston SirenPeter Addilyne Backs, MD. Electronically signed: Bobbie Stackhristopher Reid, Scribe.  04/09/16,

## 2016-04-09 NOTE — Progress Notes (Signed)
ANTIBIOTIC CONSULT NOTE- follow up  Pharmacy Consult for vancomycin, acyclovir, ceftriaxone Indication: meningitis  Allergies  Allergen Reactions  . Morphine And Related     Per significant other   Patient Measurements: Height: 6\' 2"  (188 cm) Weight: 192 lb 0.3 oz (87.1 kg) IBW/kg (Calculated) : 82.2  Vital Signs: Temp: 96.9 F (36.1 C) (10/28 0714) Temp Source: Oral (10/28 0714) BP: 123/84 (10/28 0800) Pulse Rate: 70 (10/28 0800)  Labs:  Recent Labs  04/06/16 2329 04/07/16 0441 04/08/16 0516  WBC 8.7 5.3  --   HGB 14.3 12.2*  --   PLT 193 156  --   CREATININE 1.04 0.95 1.06   Estimated Creatinine Clearance: 100.2 mL/min (by C-G formula based on SCr of 1.06 mg/dL).   Recent Labs  04/09/16 1146  VANCOTROUGH 21*    Microbiology: Recent Results (from the past 720 hour(s))  Urine culture     Status: None   Collection Time: 04/06/16 11:50 PM  Result Value Ref Range Status   Specimen Description URINE, CATHETERIZED  Final   Special Requests Normal  Final   Culture NO GROWTH [facility]   Final   Report Status 04/08/2016 FINAL  Final  Culture, blood (routine x 2)     Status: None (Preliminary result)   Collection Time: 04/07/16 12:56 AM  Result Value Ref Range Status   Specimen Description BLOOD LEFT ANTECUBITAL  Final   Special Requests   Final    BOTTLES DRAWN AEROBIC AND ANAEROBIC AEB 10CC ANA 5CC   Culture NO GROWTH 2 DAYS  Final   Report Status PENDING  Incomplete  Culture, blood (routine x 2)     Status: None (Preliminary result)   Collection Time: 04/07/16  1:06 AM  Result Value Ref Range Status   Specimen Description BLOOD LEFT HAND  Final   Special Requests BOTTLES DRAWN AEROBIC AND ANAEROBIC 8CC EACH  Final   Culture NO GROWTH 2 DAYS  Final   Report Status PENDING  Incomplete  MRSA PCR Screening     Status: None   Collection Time: 04/07/16  4:30 AM  Result Value Ref Range Status   MRSA by PCR NEGATIVE NEGATIVE Final   Comment:        The GeneXpert MRSA Assay (FDA approved for NASAL specimens only), is one component of a comprehensive MRSA colonization surveillance program. It is not intended to diagnose MRSA infection nor to guide or monitor treatment for MRSA infections.    Medical History: Past Medical History:  Diagnosis Date  . Bell palsy   . Kidney stones    Medications:  No prescriptions prior to admission.   Scheduled:  . acyclovir  10 mg/kg Intravenous Q8H  . cefTRIAXone (ROCEPHIN)  IV  2 g Intravenous Q12H  . docusate sodium  100 mg Oral BID  . folic acid  1 mg Oral Daily  . multivitamin with minerals  1 tablet Oral Daily  . pantoprazole  40 mg Oral Q0600  . potassium chloride  20 mEq Oral Daily  . sodium chloride  1,000 mL Intravenous Once  . sodium chloride flush  3 mL Intravenous Q12H  . thiamine  100 mg Oral Daily   Or  . thiamine  100 mg Intravenous Daily  . vancomycin  750 mg Intravenous Q8H   Infusions:    PRN: acetaminophen **OR** acetaminophen, LORazepam **OR** LORazepam, ondansetron **OR** ondansetron (ZOFRAN) IV Anti-infectives    Start     Dose/Rate Route Frequency Ordered Stop  04/09/16 1800  vancomycin (VANCOCIN) IVPB 750 mg/150 ml premix     750 mg 150 mL/hr over 60 Minutes Intravenous Every 8 hours 04/09/16 1257     04/07/16 1800  cefTRIAXone (ROCEPHIN) 2 g in dextrose 5 % 50 mL IVPB     2 g 100 mL/hr over 30 Minutes Intravenous Every 12 hours 04/07/16 0734     04/07/16 1300  vancomycin (VANCOCIN) IVPB 1000 mg/200 mL premix  Status:  Discontinued     1,000 mg 200 mL/hr over 60 Minutes Intravenous Every 8 hours 04/07/16 0735 04/09/16 1241   04/07/16 0430  acyclovir (ZOVIRAX) 840 mg in dextrose 5 % 150 mL IVPB     10 mg/kg  83.9 kg 166.8 mL/hr over 60 Minutes Intravenous Every 8 hours 04/07/16 0407     04/07/16 0430  vancomycin (VANCOCIN) IVPB 1000 mg/200 mL premix     1,000 mg 200 mL/hr over 60 Minutes Intravenous  Once 04/07/16 0428 04/07/16 0551    04/07/16 0415  cefTRIAXone (ROCEPHIN) 1 g in dextrose 5 % 50 mL IVPB     1 g 100 mL/hr over 30 Minutes Intravenous NOW 04/07/16 0402 04/07/16 0625   04/07/16 0045  cefTRIAXone (ROCEPHIN) 1 g in dextrose 5 % 50 mL IVPB     1 g 100 mL/hr over 30 Minutes Intravenous  Once 04/07/16 0044 04/07/16 0143     Assessment: 45 yo male with hx nephrolithiasis and Bell's palsy presented unresponsive.  Pt slurring speech and drooling. Pt reportedly refused LP.  Plan to continue vanc, acyclovir, and ceftriaxone for meningitis.  Vancomycin trough level is above target.   Goal of Therapy:  Vancomycin trough level 15-20 mcg/ml  Plan:  Continue Acyclovir 10mg /Kg IV q8hrs Decrease Vancomycin to 750mg  IV q8hrs  Rocephin 2gm IV q12hrs Duration of therapy per MD Monitor labs, progress, renal fxn, c/s  Valrie HartScott Kaity Pitstick, PharmD Clinical Pharmacist Pager:  [phone] 04/09/2016   04/09/2016,12:57 PM

## 2016-04-09 NOTE — Progress Notes (Signed)
Subjective: He was able to be successfully extubated yesterday and is awake and alert and moving around in the room. He denies headache nausea vomiting chest pain shortness of breath.  Objective: Vital signs in last 24 hours: Temp:  [96.9 F (36.1 C)-97.1 F (36.2 C)] 96.9 F (36.1 C) (10/28 0714) Pulse Rate:  [63-88] 70 (10/28 0800) Resp:  [12-27] 13 (10/28 0800) BP: (99-130)/(64-88) 123/84 (10/28 0800) SpO2:  [99 %-100 %] 100 % (10/28 0800) Weight:  [87.1 kg (192 lb 0.3 oz)] 87.1 kg (192 lb 0.3 oz) (10/28 0400) Weight change: -3.2 kg (-7 lb 0.9 oz)    Intake/Output from previous day: 10/27 0701 - 10/28 0700 In: 1743.4 [P.O.:240; I.V.:3; IV Piggyback:1500.4] Out: 4725 [Urine:4725]  PHYSICAL EXAM General appearance: alert, cooperative and mild distress Resp: clear to auscultation bilaterally Cardio: regular rate and rhythm, S1, S2 normal, no murmur, click, rub or gallop GI: soft, non-tender; bowel sounds normal; no masses,  no organomegaly Extremities: extremities normal, atraumatic, no cyanosis or edema Skin warm and dry  Lab Results:  Results for orders placed or performed during the hospital encounter of 04/06/16 (from the past 48 hour(s))  Rocky mtn spotted fvr abs pnl(IgG+IgM)     Status: None   Collection Time: 04/07/16  9:54 AM  Result Value Ref Range   RMSF IgG Negative Negative   RMSF IgM 0.38 0.00 - 0.89 index    Comment: (NOTE)                                 Negative        <0.90                                 Equivocal 0.90 - 1.10                                 Positive        >1.10 Performed At: [facility] [address] [place], [place] [id number] Lindon Romp MD [id number]   HIV antibody     Status: None   Collection Time: 04/07/16  9:54 AM  Result Value Ref Range   HIV Screen 4th Generation wRfx Non Reactive Non Reactive    Comment: (NOTE) Performed At: [facility] [place], [place] [id number] Lindon Romp MD EX:[id number]   Vitamin B12     Status: None   Collection Time: 04/07/16  9:54 AM  Result Value Ref Range   Vitamin B-12 220 180 - 914 pg/mL    Comment: (NOTE) This assay is not validated for testing neonatal or myeloproliferative syndrome specimens for Vitamin B12 levels. Performed at [facility]   CK     Status: Abnormal   Collection Time: 04/07/16  9:54 AM  Result Value Ref Range   Total CK 856 (H) 49 - 397 U/L  Glucose, capillary     Status: Abnormal   Collection Time: 04/07/16 11:57 AM  Result Value Ref Range   Glucose-Capillary 104 (H) 65 - 99 mg/dL  Glucose, capillary     Status: Abnormal   Collection Time: 04/07/16  4:52 PM  Result Value Ref Range   Glucose-Capillary 100 (H) 65 - 99 mg/dL  Glucose, capillary     Status: Abnormal   Collection Time: 04/07/16  8:22 PM  Result Value Ref Range   Glucose-Capillary 113 (H) 65 - 99 mg/dL   Comment 1 Notify RN    Comment 2 Document in Chart   Glucose, capillary     Status: None   Collection Time: 04/08/16 12:14 AM  Result Value Ref Range   Glucose-Capillary 84 65 - 99 mg/dL   Comment 1 Notify RN    Comment 2 Document in Chart   Glucose, capillary     Status: None   Collection Time: 04/08/16  4:41 AM  Result Value Ref Range   Glucose-Capillary 84 65 - 99 mg/dL   Comment 1 Notify RN    Comment 2 Document in Chart   Basic metabolic panel     Status: Abnormal   Collection Time: 04/08/16  5:16 AM  Result Value Ref Range   Sodium 140 135 - 145 mmol/L   Potassium 3.2 (L) 3.5 - 5.1 mmol/L   Chloride 118 (H) 101 - 111 mmol/L   CO2 21 (L) 22 - 32 mmol/L   Glucose, Bld 107 (H) 65 - 99 mg/dL   BUN 10 6 - 20 mg/dL   Creatinine, Ser 1.06 0.61 - 1.24 mg/dL   Calcium 7.9 (L) 8.9 - 10.3 mg/dL   GFR calc non Af Amer >60 >60 mL/min   GFR calc Af Amer >60 >60 mL/min    Comment: (NOTE) The eGFR has been calculated using the CKD EPI equation. This calculation has not been validated in all clinical  situations. eGFR's persistently <60 mL/min signify possible Chronic Kidney Disease.   Glucose, capillary     Status: None   Collection Time: 04/08/16  7:29 AM  Result Value Ref Range   Glucose-Capillary 87 65 - 99 mg/dL  Glucose, capillary     Status: None   Collection Time: 04/08/16 11:41 AM  Result Value Ref Range   Glucose-Capillary 79 65 - 99 mg/dL  Glucose, capillary     Status: None   Collection Time: 04/08/16  8:24 PM  Result Value Ref Range   Glucose-Capillary 81 65 - 99 mg/dL   Comment 1 Notify RN    Comment 2 Document in Chart     ABGS  Recent Labs  04/07/16 0019  PHART 7.349*  PO2ART 414.0*  HCO3 20.9   CULTURES Recent Results (from the past 240 hour(s))  Urine culture     Status: None   Collection Time: 04/06/16 11:50 PM  Result Value Ref Range Status   Specimen Description URINE, CATHETERIZED  Final   Special Requests Normal  Final   Culture NO GROWTH Performed at [facility]   Final   Report Status 04/08/2016 FINAL  Final  Culture, blood (routine x 2)     Status: None (Preliminary result)   Collection Time: 04/07/16 12:56 AM  Result Value Ref Range Status   Specimen Description BLOOD LEFT ANTECUBITAL  Final   Special Requests   Final    BOTTLES DRAWN AEROBIC AND ANAEROBIC AEB 10CC ANA 5CC   Culture NO GROWTH 2 DAYS  Final   Report Status PENDING  Incomplete  Culture, blood (routine x 2)     Status: None (Preliminary result)   Collection Time: 04/07/16  1:06 AM  Result Value Ref Range Status   Specimen Description BLOOD LEFT HAND  Final   Special Requests BOTTLES DRAWN AEROBIC AND ANAEROBIC 8CC EACH  Final   Culture NO GROWTH 2 DAYS  Final   Report Status PENDING  Incomplete  MRSA PCR  Screening     Status: None   Collection Time: 04/07/16  4:30 AM  Result Value Ref Range Status   MRSA by PCR NEGATIVE NEGATIVE Final    Comment:        The GeneXpert MRSA Assay (FDA approved for NASAL specimens only), is one component of a comprehensive  MRSA colonization surveillance program. It is not intended to diagnose MRSA infection nor to guide or monitor treatment for MRSA infections.    Studies/Results: No results found.  Medications:  Prior to Admission:  No prescriptions prior to admission.   Scheduled: . acyclovir  10 mg/kg Intravenous Q8H  . cefTRIAXone (ROCEPHIN)  IV  2 g Intravenous Q12H  . docusate sodium  100 mg Oral BID  . folic acid  1 mg Oral Daily  . multivitamin with minerals  1 tablet Oral Daily  . pantoprazole  40 mg Oral Q0600  . potassium chloride  20 mEq Oral Daily  . sodium chloride  1,000 mL Intravenous Once  . sodium chloride flush  3 mL Intravenous Q12H  . thiamine  100 mg Oral Daily   Or  . thiamine  100 mg Intravenous Daily  . vancomycin  1,000 mg Intravenous Q8H   Continuous:  IHW:[id number] **OR** acetaminophen, LORazepam **OR** LORazepam, ondansetron **OR** ondansetron (ZOFRAN) IV  Assesment: He was admitted with altered mental status. Most likely cause now appears to be seizure. He did have a seizure in the emergency department and may have had one at home. He does not appear to have encephalitis or meningitis now. His respiratory failure has resolved. Principal Problem:   Altered mental status Active Problems:   Hypokalemia    Plan: I will plan to sign off at this point. He is on nasal oxygen doing well no complaints of shortness of breath. I will be happy to see him again if needed.  Thanks for allowing me to see him with you     LOS: 2 days   Dorrian Doggett L 04/09/2016, 9:28 AM
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
## 2016-04-09 NOTE — Progress Notes (Signed)
Called report to Alejandro LukeLisa Covington, RN on dept 300. Verbalized understanding. Pt transferred to 304 with suicide sitter in safe and stable condition.

## 2016-04-10 MED ORDER — PHENOL 1.4 % MT LIQD
1.0000 | OROMUCOSAL | Status: DC | PRN
  Administered 2016-04-10 (×2): 1 via OROMUCOSAL
  Filled 2016-04-10: qty 177

## 2016-04-10 MED ORDER — SODIUM CHLORIDE 0.9% FLUSH: 3.0000 mL | INTRAVENOUS | Status: DC | PRN

## 2016-04-10 MED ORDER — ENOXAPARIN SODIUM 40 MG/0.4ML ~~LOC~~ SOLN
40.0000 mg | SUBCUTANEOUS | Status: DC
  Administered 2016-04-10 – 2016-04-11 (×2): 40 mg via SUBCUTANEOUS
  Filled 2016-04-10 (×2): qty 0.4

## 2016-04-10 MED ORDER — SODIUM CHLORIDE 0.9 % IV SOLN: 250.0000 mL | INTRAVENOUS | Status: DC | PRN

## 2016-04-10 NOTE — Progress Notes (Signed)
PROGRESS NOTE    Alejandro HeckBrian Conner  UJW:[id number] DOB: [birth year] DOA: 04/06/2016 PCP: No PCP Per Patient   Brief Narrative: Patient was found unconscious, after a "cold" a week prior, had a seizure in the ED, intubated for airway protection, LP attempted failed, LP held as he had one dose of Lovenox yesterday, Tx with IV Van/Rocephin 2gQ12/ Acyclovir and maintained intubated.  Work up showed amphetamine and trace alcohol level.  He was found to also have UTI.  He was started on CIWA, and IV Keppra was given.  Neurology was consulted, and recommend continuation of Tx. Appreciate PCCM's help as well.  He was subsequently extubated successfully, and was able to say he was suicidal and overdosed on his fiancee drug and some others, unclear what they were.  His airborn precaution was lifted, and he was taken to IR for floroscopic LP, but he refused.  Sitter was placed, and psych initial eval was done, but he was too lethargic.  He is better now, and able to eat himself.  Psych would like to have a follow up interview prior to clearing him for discharge.  Plan to change to oral antibiotics and antiviral tomorrow and complete a 10 days course. His Keppra was discontinued.   Assessment & Plan:   1. Acute encephalopathy:  Still unclear, but it appears at the least, he was attempted suicide.  Though he denied any drinking, he has positive ethyl alcohol in his system. At this point, given uncertainty and his refusal to permit LP, I recommended completing his antibiotics and antiviral at this time.  He is better today, clearly, so it is encouraging.  Psych would like to have a follow up interview prior to clearing him for discharge.    2. Seizure:  Unclear if metabolic derrangement, primary, or alcohol withdrawal.  Keppra d/c.  He cannot drive until cleared by neurology.  3. 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  US of the kidney showed bladder distention and mild bilateral hydronephrosis. He is voiding OK.     DVT prophylaxis: Lovenox Code Status: FULL CODE.  Family Communication: father, step mother.  Disposition Plan: Likely Tuesday.   Consultants:   Psychiatry.  PCCM.   Procedures:   Intubation  LP.   Antimicrobials: Anti-infectives    Start     Dose/Rate Route Frequency Ordered Stop   04/09/16 1800  vancomycin (VANCOCIN) IVPB 750 mg/150 ml premix     750 mg 150 mL/hr over 60 Minutes Intravenous Every 8 hours 04/09/16 1257     04/07/16 1800  cefTRIAXone (ROCEPHIN) 2 g in dextrose 5 % 50 mL IVPB     2 g 100 mL/hr over 30 Minutes Intravenous Every 12 hours 04/07/16 0734     04/07/16 1300  vancomycin (VANCOCIN) IVPB 1000 mg/200 mL premix  Status:  Discontinued     1,000 mg 200 mL/hr over 60 Minutes Intravenous Every 8 hours 04/07/16 0735 04/09/16 1241   04/07/16 0430  acyclovir (ZOVIRAX) 840 mg in dextrose 5 % 150 mL IVPB     10 mg/kg  83.9 kg 166.8 mL/hr over 60 Minutes Intravenous Every 8 hours 04/07/16 0407     04/07/16 0430  vancomycin (VANCOCIN) IVPB 1000 mg/200 mL premix     1,000 mg 200 mL/hr over 60 Minutes Intravenous  Once 04/07/16 0428 04/07/16 0551   04/07/16 0415  cefTRIAXone (ROCEPHIN) 1  g in dextrose 5 % 50 mL IVPB     1 g 100 mL/hr over 30 Minutes Intravenous NOW 04/07/16 0402 04/07/16 0625   04/07/16 0045  cefTRIAXone (ROCEPHIN) 1 g in dextrose 5 % 50 mL IVPB     1 g 100 mL/hr over 30 Minutes Intravenous  Once 04/07/16 0044 04/07/16 0143       Subjective:  Feeling better.    Objective: Vitals:   04/09/16 1300 04/09/16 2129 04/10/16 0030 04/10/16 0459  BP: (!) 141/78 117/60 133/83 (!) 142/88  Pulse: 83 79 69 81  Resp:  14 16 16   Temp: 98.7 F (37.1 C) 97.1 F (36.2 C) 97.9 F (36.6 C) 97.8 F (36.6 C)  TempSrc:  Oral Oral Oral  SpO2: 100% 99% 99% 100%  Weight:    87.4 kg (192 lb 10.9 oz)  Height:        Intake/Output Summary (Last  24 hours) at 04/10/16 0753 Last data filed at 04/10/16 0441  Gross per 24 hour  Intake          4234.97 ml  Output             1100 ml  Net          3134.97 ml   Filed Weights   04/08/16 0400 04/09/16 0400 04/10/16 0459  Weight: 90.3 kg (199 lb 1.2 oz) 87.1 kg (192 lb 0.3 oz) 87.4 kg (192 lb 10.9 oz)    Examination:  General exam: Appears calm and comfortable  Respiratory system: Clear to auscultation. Respiratory effort normal. Cardiovascular system: S1 & S2 heard, RRR. No JVD, murmurs, rubs, gallops or clicks. No pedal edema. Gastrointestinal system: Abdomen is nondistended, soft and nontender. No organomegaly or masses felt. Normal bowel sounds heard. Central nervous system: Alert and oriented. No focal neurological deficits. Extremities: Symmetric 5 x 5 power. Skin: No rashes, lesions or ulcers Psychiatry: Judgement and insight appear normal. Mood & affect appropriate.   Data Reviewed: I have personally reviewed following labs and imaging studies  CBC:  Recent Labs Lab 04/06/16 2329 04/07/16 0441  WBC 8.7 5.3  NEUTROABS 6.0  --   HGB 14.3 12.2*  HCT 41.8 36.4*  MCV 91.3 91.0  PLT 193 156   Basic Metabolic Panel:  Recent Labs Lab 04/06/16 2329 04/07/16 0441 04/08/16 0516  NA 138 138 140  K 3.2* 3.1* 3.2*  CL 106 112* 118*  CO2 21* 20* 21*  GLUCOSE 122* 96 107*  BUN 17 16 10   CREATININE 1.04 0.95 1.06  CALCIUM 9.2 7.9* 7.9*   GFR: Estimated Creatinine Clearance: 100.2 mL/min (by C-G formula based on SCr of 1.06 mg/dL). Liver Function Tests:  Recent Labs Lab 04/06/16 2329  AST 63*  ALT 34  ALKPHOS 63  BILITOT 1.2  PROT 7.3  ALBUMIN 4.5   Coagulation Profile:  Recent Labs Lab 04/07/16 0441  INR 1.16   Cardiac Enzymes:  Recent Labs Lab 04/06/16 2329 04/07/16 0954  CKTOTAL  --  856*  TROPONINI <0.03  --    CBG:  Recent Labs Lab 04/08/16 0014 04/08/16 0441 04/08/16 0729 04/08/16 1141 04/08/16 2024  GLUCAP 84 84 87 79 81    Anemia Panel:  Recent Labs  04/07/16 0954  VITAMINB12 220   Sepsis Labs:  Recent Labs Lab 04/07/16 0057 04/07/16 0441 04/07/16 0819  PROCALCITON  --  <0.10  --   LATICACIDVEN 0.8 0.7 1.0    Recent Results (from the past 240 hour(s))  Urine culture  Status: None   Collection Time: 04/06/16 11:50 PM  Result Value Ref Range Status   Specimen Description URINE, CATHETERIZED  Final   Special Requests Normal  Final   Culture NO GROWTH Performed at [facility]   Final   Report Status 04/08/2016 FINAL  Final  Culture, blood (routine x 2)     Status: None (Preliminary result)   Collection Time: 04/07/16 12:56 AM  Result Value Ref Range Status   Specimen Description BLOOD LEFT ANTECUBITAL  Final   Special Requests   Final    BOTTLES DRAWN AEROBIC AND ANAEROBIC AEB=10CC ANA=5CC   Culture NO GROWTH 3 DAYS  Final   Report Status PENDING  Incomplete  Culture, blood (routine x 2)     Status: None (Preliminary result)   Collection Time: 04/07/16  1:06 AM  Result Value Ref Range Status   Specimen Description BLOOD LEFT HAND  Final   Special Requests BOTTLES DRAWN AEROBIC AND ANAEROBIC 8CC EACH  Final   Culture NO GROWTH 3 DAYS  Final   Report Status PENDING  Incomplete  MRSA PCR Screening     Status: None   Collection Time: 04/07/16  4:30 AM  Result Value Ref Range Status   MRSA by PCR NEGATIVE NEGATIVE Final    Comment:        The GeneXpert MRSA Assay (FDA approved for NASAL specimens only), is one component of a comprehensive MRSA colonization surveillance program. It is not intended to diagnose MRSA infection nor to guide or monitor treatment for MRSA infections.      Radiology Studies: US Renal  Result Date: 04/09/2016 CLINICAL DATA:  Nephrolithiasis and urinary tract infection. EXAM: RENAL / URINARY TRACT ULTRASOUND COMPLETE COMPARISON:  None. FINDINGS: Right Kidney: Length: 13.6 cm. Mild hydronephrosis present. Multiple echogenic foci likely  represent calculi. Renal cortex is lobulated, likely reflecting cortical scarring. The cortex is also mildly echogenic and there may be a component of chronic kidney disease. No focal solid masses identified. Left Kidney: Length: 13.1 cm. Mild dilatation of the upper pole collecting system without significant hydronephrosis. Potential small calculi in the upper pole. Mildly increased cortical echogenicity may be consistent with chronic kidney disease. No focal solid masses identified. Bladder: The bladder is distended at the time of imaging. The patient refused to void during the examination to determine defect of bladder distension on hydronephrosis. IMPRESSION: 1. Mild right hydronephrosis with multiple echogenic calculi identified. Renal scarring is present as well as mildly increased cortical echogenicity suggestive of chronic kidney disease. 2. Mild dilatation of the upper pole collecting system in the left kidney with potential small upper pole calculi. Renal cortex also demonstrates mildly increased echogenicity suggestive of chronic kidney disease. 3. Bladder distension. Bladder distention could be contributing to some degree of the hydronephrosis identified by ultrasound. Electronically Signed   By: Irish Lack M.D.   On: 04/09/2016 12:15    Scheduled Meds: . acyclovir  10 mg/kg Intravenous Q8H  . cefTRIAXone (ROCEPHIN)  IV  2 g Intravenous Q12H  . docusate sodium  100 mg Oral BID  . folic acid  1 mg Oral Daily  . multivitamin with minerals  1 tablet Oral Daily  . nicotine  14 mg Transdermal Daily  . pantoprazole  40 mg Oral Q0600  . potassium chloride  20 mEq Oral Daily  . sodium chloride  1,000 mL Intravenous Once  . sodium chloride flush  3 mL Intravenous Q12H  . thiamine  100 mg Oral Daily  Or  . thiamine  100 mg Intravenous Daily  . vancomycin  750 mg Intravenous Q8H   Continuous Infusions:    LOS: 3 days   Aemon Koeller, MD FACP Hospitalist.   If 7PM-7AM, please contact  night-coverage [email or web address] Password [email or web address] 04/10/2016, 7:53 AM

## 2016-04-11 DIAGNOSIS — Z888 Allergy status to other drugs, medicaments and biological substances status: Secondary | ICD-10-CM

## 2016-04-11 LAB — BASIC METABOLIC PANEL
Anion gap: 6 (ref 5–15)
BUN: 13 mg/dL (ref 6–20)
CHLORIDE: 103 mmol/L (ref 101–111)
CO2: 28 mmol/L (ref 22–32)
Calcium: 8.7 mg/dL — ABNORMAL LOW (ref 8.9–10.3)
Creatinine, Ser: 0.83 mg/dL (ref 0.61–1.24)
GFR calc non Af Amer: >60 mL/min (ref >60)
Glucose, Bld: 101 mg/dL — ABNORMAL HIGH (ref 65–99)
POTASSIUM: 3.3 mmol/L — AB (ref 3.5–5.1)
SODIUM: 137 mmol/L (ref 135–145)

## 2016-04-11 MED ORDER — KETOROLAC TROMETHAMINE 30 MG/ML IJ SOLN
30.0000 mg | Freq: Once | INTRAMUSCULAR | Status: AC
  Administered 2016-04-11: 30 mg via INTRAVENOUS
  Filled 2016-04-11: qty 1

## 2016-04-11 MED ORDER — NICOTINE 14 MG/24HR TD PT24: 14.0000 mg | MEDICATED_PATCH | Freq: Every day | TRANSDERMAL | 0 refills | Status: DC

## 2016-04-11 MED ORDER — ACYCLOVIR 400 MG PO TABS: 400.0000 mg | ORAL_TABLET | Freq: Every day | ORAL | 0 refills | Status: DC

## 2016-04-11 MED ORDER — NICOTINE 14 MG/24HR TD PT24
14.0000 mg | MEDICATED_PATCH | Freq: Every day | TRANSDERMAL | Status: DC
  Administered 2016-04-11: 14 mg via TRANSDERMAL

## 2016-04-11 MED ORDER — PANTOPRAZOLE SODIUM 40 MG PO TBEC: 40.0000 mg | DELAYED_RELEASE_TABLET | Freq: Every day | ORAL | 0 refills | Status: DC

## 2016-04-11 MED ORDER — CEFUROXIME AXETIL 500 MG PO TABS: 500.0000 mg | ORAL_TABLET | Freq: Two times a day (BID) | ORAL | 0 refills | Status: DC

## 2016-04-11 NOTE — Care Management Note (Signed)
Case Management Note  Patient Details  Name: Alejandro Conner MRN: [id number] Date of Birth: 08/17/1970  Subjective/Objective:                  Pt admitted for overdose. Pt is from home and is ind with ADL's. He is employed and uninsured. Pt plans to return home with self care. Pt does not have PCP, he was given list of providers who will see him with no insurance. [facility] Pt given handout explaining process. Pt given MATCH voucher to assist with medications at DC. TTS has evaluated pt and determined inpt treatment is not necessary at this time.   Action/Plan: Discharging home today.  Expected Discharge Date:    04/11/2016              Expected Discharge Plan:  Home/Self Care  In-House Referral:  Clinical Social Work  Discharge planning Services  [facility]  Post Acute Care Choice:  NA Choice offered to:  NA  Status of Service:  Completed, signed off   Malcolm MetroChildress, Khalise Billard Demske, RN 04/11/2016, 2:03 PM

## 2016-04-11 NOTE — Discharge Summary (Signed)
Physician Discharge Summary  Alejandro HeckBrian Joerger ZOX:[id number] DOB: September 06, 1970 DOA: 04/06/2016  PCP: No PCP Per Patient  Admit date: 04/06/2016 Discharge date: 04/11/2016  Admitted From: Home. Disposition:  Home.   Recommendations for Outpatient Follow-up:  1. Follow up with PCP in 1-2 weeks.  Patient sees his doctors at the health clinic.   Home Health: No.  Equipment/Devices:No Discharge Condition: Stable. CODE STATUS:FULL CODE.  Diet recommendation: Heart heatlhy.   Brief/Interim Summary:  Patient was admitted for unresponsiveness by Dr Ophelia CharterYates on Apr 07, 2916.  As per her H and P:  " HPI: Alejandro Conner is a 45 y.o. male with medical history significant of nephrolithiasis and Bell's palsy presenting with unresponsiveness.  Patient is unable to provide history; it was obtained by his fiance.  She reports that he has had a cold.  He thought he was getting better.  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  While out of town, noticed eye drooling and mouth was drooping on the right.  Was having so much trouble that he asked girlfriend to come pick him up.  He has had bells's palsy in the past and thought it might be recurring.  Asked girlfriend to go get vitamins for him from the store.  She and her son came home and couldn't get in the house.  The patient was lying in living room floor and seemed unable to get up to the door.  Called 911;  Difficulty getting him to the door but he was able to eventually unlatch it.  Maybe cooking and tried to burn label off bottle? It was Etodolac.  Otherwise nothing was obviously out of order.  *Called by ER nurse for seizure-like activity.  Posturing of the upper extremities with extension of the back, with repetitive movements lasting for about 60 seconds.  By the time I went to assess the patient, the movements had ended.   ED Course: Per Dr. Lynelle DoctorKnapp: 11:25 PM Will order urinalysis, urine rapid drug screen, CT head wo contrast and DG chest port 1 view.   After  arrival patient was given Narcan 1 mg IV. Before the Narcan and he was having nonpurposeful movement of his arms and trying to sit up. He was nonverbal and would not follow commands. After the Narcan and he laid on the stretcher and became quiet and then started having apneic episodes. Patient was prepared for intubation.   00:10 AM Pt's girlfriend states that today he was slurring his speech and drooling. Pt was recently diagnosed with Bell's palsy and had run out of his medication. When she came home, she found pt unconscious on the floor. She states that her glass coffee table was shattered as if he had fallen on it. Pt was able to crawl and unlock the door when EMS arrived, but then passed out again.   After reviewing his lab work patient was started on Rocephin for possible UTI.  01:08 AM Dr Basil DessJ Yates, admit to ICU  HOSPITAL COURSE: Patient was admitted after intubation for airway protection after he had a seizure in the ER.  He was attempted at LP, but unsucessful, therefore, he was given Van/Rocephin/ Acyclovir to cover potential meningococcal meningitis, along with HSVE.  He was placed on airborn precaution, and was sedated.  PCCM was consulted, and neurology was consulted as well.  He was placed on IV Keppra, and it was continued at the beginning. The following day, he was placed on the ventilator and observed.  LP was requested  to be done by IR, for further guiding therapy.  Conversation was extensive to [employer], but it was not totally helpful, as what she was saying, we already had it recorded, mainly that patient was previously having substance abuse, but he no longer a user, and he did not drink alcohol.  Event unfolded as the following day, he was well enough to be extubated, and taken to the IR for LP, after airborn precaution was lifted, and declined LP at this time.  He was alert, oriented, and conversed meaningfully, so his decision was honored.  At this time, he told his father, who  was able to come to visit him with his step mother, that he was trying to kill himself, and overdose on unclear medication.  His alcohol level was modest, tylenol and ASA level were negative, and his serologies were benign, with no increased AG metabolic acidosis.  Sitter was ordered, and TTS psychiatry consultation was done, and he was cleared, but psych would like to talk to him again prior to discharge.  Psych was reconsulted today, and cleared him for discharge, with no active suicidal ideation.  Contract with his family was done, that he has no access to potential harmful medication.  He has no weapon and has no plan to hurt himself.  Psych determined that he is safe to be discharge.  I agreed after speaking to him myself as well.  He would like to quit smoking, and was given a nicotine patch.  He had mild HA, and was given one dose of Toradol 30mg  IV X 1 today.  He also has hx of nephrolithiasis, and had ureteral stent placement on the left flank, as I can see a scar.  His renal fx has been normal, and an [place] was done, showing mild hydronephrosis bilaterally, with bladder distention.  He was able to void.  He was told of the finding, and he will follow up with his urologist. He confirmed that this is not a new problem.  He was given a list of psychiatrists to follow up, along with following up with his PCP as discussed.  I will finish another week of Ceftin 500mg  BID, and Acyclovir 400mg  5x per day, though the likely hood of him having infection at this time is very low, given the new discovery of what had happened to him.  Thank you so much for an opportunity to care for him.  Good Day.   Discharge Diagnoses:  Principal Problem:   Altered mental status Active Problems:   Hypokalemia    Discharge Instructions  Discharge Instructions    Diet - low sodium heart healthy    Complete by:  As directed    Increase activity slowly    Complete by:  As directed        Medication List    TAKE these  medications   acyclovir 400 MG tablet Commonly known as:  ZOVIRAX Take 1 tablet (400 mg total) by mouth 5 (five) times daily.   cefUROXime 500 MG tablet Commonly known as:  CEFTIN Take 1 tablet (500 mg total) by mouth 2 (two) times daily with a meal.   nicotine 14 mg/24hr patch Commonly known as:  NICODERM CQ - dosed in mg/24 hours Place 1 patch (14 mg total) onto the skin daily. Start taking on:  04/12/2016   pantoprazole 40 MG tablet Commonly known as:  PROTONIX Take 1 tablet (40 mg total) by mouth daily.       Allergies  Allergen  Reactions  . Morphine And Related     Per significant other(Unknown)    Consultations:     Procedures/Studies: Ct Head Wo Contrast  Result Date: 04/07/2016 CLINICAL DATA:  Altered mental status, intubation. Possible drug overdose. EXAM: CT HEAD WITHOUT CONTRAST TECHNIQUE: Contiguous axial images were obtained from the base of the skull through the vertex without intravenous contrast. COMPARISON:  None. FINDINGS: BRAIN: The ventricles and sulci are normal. No intraparenchymal hemorrhage, mass effect nor midline shift. No acute large vascular territory infarcts. No abnormal extra-axial fluid collections. Basal cisterns are patent. VASCULAR: Unremarkable. SKULL/SOFT TISSUES: No skull fracture. No significant soft tissue swelling. ORBITS/SINUSES: The included ocular globes and orbital contents are normal.Mild paranasal sinus mucosal thickening. Mastoid air cells are well aerated. OTHER: Fullness of the nasopharyngeal soft tissues, attributable to intubation. IMPRESSION: Normal CT HEAD. Electronically Signed   By: Awilda Metro M.D.   On: 04/07/2016 00:38   US Renal  Result Date: 04/09/2016 CLINICAL DATA:  Nephrolithiasis and urinary tract infection. EXAM: RENAL / URINARY TRACT ULTRASOUND COMPLETE COMPARISON:  None. FINDINGS: Right Kidney: Length: 13.6 cm. Mild hydronephrosis present. Multiple echogenic foci likely represent calculi. Renal cortex  is lobulated, likely reflecting cortical scarring. The cortex is also mildly echogenic and there may be a component of chronic kidney disease. No focal solid masses identified. Left Kidney: Length: 13.1 cm. Mild dilatation of the upper pole collecting system without significant hydronephrosis. Potential small calculi in the upper pole. Mildly increased cortical echogenicity may be consistent with chronic kidney disease. No focal solid masses identified. Bladder: The bladder is distended at the time of imaging. The patient refused to void during the examination to determine defect of bladder distension on hydronephrosis. IMPRESSION: 1. Mild right hydronephrosis with multiple echogenic calculi identified. Renal scarring is present as well as mildly increased cortical echogenicity suggestive of chronic kidney disease. 2. Mild dilatation of the upper pole collecting system in the left kidney with potential small upper pole calculi. Renal cortex also demonstrates mildly increased echogenicity suggestive of chronic kidney disease. 3. Bladder distension. Bladder distention could be contributing to some degree of the hydronephrosis identified by ultrasound. Electronically Signed   By: Irish Lack M.D.   On: 04/09/2016 12:15   Dg Chest Port 1 View  Result Date: 04/06/2016 CLINICAL DATA:  45 year old male with altered mental status. Status post intubation. EXAM: PORTABLE CHEST 1 VIEW COMPARISON:  None. FINDINGS: An endotracheal tube is noted with tip approximately 3.6 cm above the carina. An enteric tube courses into the left upper abdomen with tip under the left hemidiaphragm, likely in the gastric fundus. Mild diffuse interstitial coarsening. No focal consolidation, pleural effusion, or pneumothorax. Top-normal cardiac size. No acute osseous pathology. IMPRESSION: Endotracheal tube above the carina and enteric tube with tip in the left upper abdomen likely in the stomach. Electronically Signed   By: Elgie Collard M.D.   On: 04/06/2016 23:57      Subjective:   Discharge Exam: Vitals:   04/10/16 2120 04/11/16 0546  BP: 140/81 138/84  Pulse: 69 84  Resp: 16 15  Temp: 98.2 F (36.8 C) 98.2 F (36.8 C)   Vitals:   04/10/16 0459 04/10/16 1405 04/10/16 2120 04/11/16 0546  BP: (!) 142/88 (!) 146/81 140/81 138/84  Pulse: 81 76 69 84  Resp: 16 16 16 15   Temp: 97.8 F (36.6 C) 97.7 F (36.5 C) 98.2 F (36.8 C) 98.2 F (36.8 C)  TempSrc: Oral Oral Oral Oral  SpO2: 100% 100% 98%  98%  Weight: 87.4 kg (192 lb 10.9 oz)   83.2 kg (183 lb 6.8 oz)  Height:        General: Pt is alert, awake, not in acute distress Cardiovascular: RRR, S1/S2 +, no rubs, no gallops Respiratory: CTA bilaterally, no wheezing, no rhonchi Abdominal: Soft, NT, ND, bowel sounds + Extremities: no edema, no cyanosis    The results of significant diagnostics from this hospitalization (including imaging, microbiology, ancillary and laboratory) are listed below for reference.     Microbiology: Recent Results (from the past 240 hour(s))  Urine culture     Status: None   Collection Time: 04/06/16 11:50 PM  Result Value Ref Range Status   Specimen Description URINE, CATHETERIZED  Final   Special Requests Normal  Final   Culture [facility] GROWTH [facility]   Final   Report Status 04/08/2016 FINAL  Final  Culture, blood (routine x 2)     Status: None (Preliminary result)   Collection Time: 04/07/16 12:56 AM  Result Value Ref Range Status   Specimen Description BLOOD LEFT ANTECUBITAL  Final   Special Requests   Final    BOTTLES DRAWN AEROBIC AND ANAEROBIC AEB=10CC ANA=5CC   Culture NO GROWTH 4 DAYS  Final   Report Status PENDING  Incomplete  Culture, blood (routine x 2)     Status: None (Preliminary result)   Collection Time: 04/07/16  1:06 AM  Result Value Ref Range Status   Specimen Description BLOOD LEFT HAND  Final   Special Requests BOTTLES DRAWN AEROBIC AND ANAEROBIC 8CC EACH  Final    Culture NO GROWTH 4 DAYS  Final   Report Status PENDING  Incomplete  MRSA PCR Screening     Status: None   Collection Time: 04/07/16  4:30 AM  Result Value Ref Range Status   MRSA by PCR NEGATIVE NEGATIVE Final    Comment:        The GeneXpert MRSA Assay (FDA approved for NASAL specimens only), is one component of a comprehensive MRSA colonization surveillance program. It is not intended to diagnose MRSA infection nor to guide or monitor treatment for MRSA infections.      Labs: BNP (last 3 results) No results for input(s): BNP in the last 8760 hours. Basic Metabolic Panel:  Recent Labs Lab 04/06/16 2329 04/07/16 0441 04/08/16 0516 04/11/16 0559  NA 138 138 140 137  K 3.2* 3.1* 3.2* 3.3*  CL 106 112* 118* 103  CO2 21* 20* 21* 28  GLUCOSE 122* 96 107* 101*  BUN 17 16 10 13   CREATININE 1.04 0.95 1.06 0.83  CALCIUM 9.2 7.9* 7.9* 8.7*   Liver Function Tests:  Recent Labs Lab 04/06/16 2329  AST 63*  ALT 34  ALKPHOS 63  BILITOT 1.2  PROT 7.3  ALBUMIN 4.5   CBC:  Recent Labs Lab 04/06/16 2329 04/07/16 0441  WBC 8.7 5.3  NEUTROABS 6.0  --   HGB 14.3 12.2*  HCT 41.8 36.4*  MCV 91.3 91.0  PLT 193 156   Cardiac Enzymes:  Recent Labs Lab 04/06/16 2329 04/07/16 0954  CKTOTAL  --  856*  TROPONINI <0.03  --    BNP: Invalid input(s): POCBNP CBG:  Recent Labs Lab 04/08/16 0014 04/08/16 0441 04/08/16 0729 04/08/16 1141 04/08/16 2024  GLUCAP 84 84 87 79 81   Urinalysis    Component Value Date/Time   COLORURINE YELLOW 04/06/2016 2350   APPEARANCEUR CLEAR 04/06/2016 2350   LABSPEC 1.010 04/06/2016 2350  PHURINE 6.0 04/06/2016 2350   GLUCOSEU NEGATIVE 04/06/2016 2350   HGBUR LARGE (A) 04/06/2016 2350   BILIRUBINUR LARGE (A) 04/06/2016 2350   KETONESUR TRACE (A) 04/06/2016 2350   PROTEINUR NEGATIVE 04/06/2016 2350   NITRITE NEGATIVE 04/06/2016 2350   LEUKOCYTESUR SMALL (A) 04/06/2016 2350   Sepsis Labs Invalid input(s): PROCALCITONIN,   WBC,  LACTICIDVEN Microbiology Recent Results (from the past 240 hour(s))  Urine culture     Status: None   Collection Time: 04/06/16 11:50 PM  Result Value Ref Range Status   Specimen Description URINE, CATHETERIZED  Final   Special Requests Normal  Final   Culture NO GROWTH Performed at [facility]   Final   Report Status 04/08/2016 FINAL  Final  Culture, blood (routine x 2)     Status: None (Preliminary result)   Collection Time: 04/07/16 12:56 AM  Result Value Ref Range Status   Specimen Description BLOOD LEFT ANTECUBITAL  Final   Special Requests   Final    BOTTLES DRAWN AEROBIC AND ANAEROBIC AEB=10CC ANA=5CC   Culture NO GROWTH 4 DAYS  Final   Report Status PENDING  Incomplete  Culture, blood (routine x 2)     Status: None (Preliminary result)   Collection Time: 04/07/16  1:06 AM  Result Value Ref Range Status   Specimen Description BLOOD LEFT HAND  Final   Special Requests BOTTLES DRAWN AEROBIC AND ANAEROBIC 8CC EACH  Final   Culture NO GROWTH 4 DAYS  Final   Report Status PENDING  Incomplete  MRSA PCR Screening     Status: None   Collection Time: 04/07/16  4:30 AM  Result Value Ref Range Status   MRSA by PCR NEGATIVE NEGATIVE Final    Comment:        The GeneXpert MRSA Assay (FDA approved for NASAL specimens only), is one component of a comprehensive MRSA colonization surveillance program. It is not intended to diagnose MRSA infection nor to guide or monitor treatment for MRSA infections.      Time coordinating discharge: Over 30 minutes  SIGNED:  Houston Siren, MD FACP [facility] 04/11/2016, 2:53 PM   If 7PM-7AM, please contact night-coverage [email or web address] Password [email or web address]
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
## 2016-04-11 NOTE — Clinical Social Work Note (Signed)
Per TTS, pt is psychiatrically cleared. At their recommendation, discussed that pt not have access to medications in the home. Pt's fiance agrees. Pt is willing to consider outpatient therapy. CSW provided list of providers and pt states he has transportation.   Derenda FennelKara Aamya Orellana, LCSW [phone]

## 2016-04-12 LAB — CULTURE, BLOOD (ROUTINE X 2)
CULTURE: NO GROWTH
Culture: NO GROWTH

## 2016-05-25 ENCOUNTER — Emergency Department (HOSPITAL_COMMUNITY)
Admission: EM | Admit: 2016-05-25 | Discharge: 2016-05-25 | Disposition: A | Discharge status: Home / Self Care | Payer: Self-pay | Attending: Dermatology | Admitting: Dermatology

## 2016-05-25 ENCOUNTER — Emergency Department (HOSPITAL_COMMUNITY)
Admission: EM | Admit: 2016-05-25 | Discharge: 2016-05-26 | Payer: Self-pay | Attending: Emergency Medicine | Admitting: Emergency Medicine

## 2016-05-25 ENCOUNTER — Encounter (HOSPITAL_COMMUNITY): Payer: Self-pay

## 2016-05-25 DIAGNOSIS — T368X2A Poisoning by other systemic antibiotics, intentional self-harm, initial encounter: Secondary | ICD-10-CM | POA: Insufficient documentation

## 2016-05-25 DIAGNOSIS — Z5321 Procedure and treatment not carried out due to patient leaving prior to being seen by health care provider: Secondary | ICD-10-CM | POA: Insufficient documentation

## 2016-05-25 DIAGNOSIS — Z5181 Encounter for therapeutic drug level monitoring: Secondary | ICD-10-CM | POA: Insufficient documentation

## 2016-05-25 DIAGNOSIS — R45851 Suicidal ideations: Secondary | ICD-10-CM

## 2016-05-25 DIAGNOSIS — F17200 Nicotine dependence, unspecified, uncomplicated: Secondary | ICD-10-CM | POA: Insufficient documentation

## 2016-05-25 DIAGNOSIS — R319 Hematuria, unspecified: Secondary | ICD-10-CM | POA: Insufficient documentation

## 2016-05-25 DIAGNOSIS — F332 Major depressive disorder, recurrent severe without psychotic features: Secondary | ICD-10-CM | POA: Insufficient documentation

## 2016-05-25 LAB — CBC WITH DIFFERENTIAL/PLATELET
Basophils Absolute: 0 10*3/uL (ref 0.0–0.1)
Basophils Relative: 0 %
EOS ABS: 0.2 10*3/uL (ref 0.0–0.7)
EOS PCT: 2 %
HCT: 49.9 % (ref 39.0–52.0)
HEMOGLOBIN: 17.5 g/dL — AB (ref 13.0–17.0)
LYMPHS ABS: 1.8 10*3/uL (ref 0.7–4.0)
Lymphocytes Relative: 15 %
MCH: 31.8 pg (ref 26.0–34.0)
MCHC: 35.1 g/dL (ref 30.0–36.0)
MCV: 90.7 fL (ref 78.0–100.0)
MONOS PCT: 10 %
Monocytes Absolute: 1.2 10*3/uL — ABNORMAL HIGH (ref 0.1–1.0)
Neutro Abs: 9 10*3/uL — ABNORMAL HIGH (ref 1.7–7.7)
Neutrophils Relative %: 73 %
Platelets: 214 10*3/uL (ref 150–400)
RBC: 5.5 MIL/uL (ref 4.22–5.81)
RDW: 13.6 % (ref 11.5–15.5)
WBC: 12.2 10*3/uL — ABNORMAL HIGH (ref 4.0–10.5)

## 2016-05-25 LAB — COMPREHENSIVE METABOLIC PANEL
ALBUMIN: 5.1 g/dL — AB (ref 3.5–5.0)
ALT: 22 U/L (ref 17–63)
ANION GAP: 12 (ref 5–15)
AST: 32 U/L (ref 15–41)
Alkaline Phosphatase: 86 U/L (ref 38–126)
BUN: 13 mg/dL (ref 6–20)
CHLORIDE: 105 mmol/L (ref 101–111)
CO2: 23 mmol/L (ref 22–32)
Calcium: 10.3 mg/dL (ref 8.9–10.3)
Creatinine, Ser: 1.02 mg/dL (ref 0.61–1.24)
GFR calc Af Amer: >60 mL/min (ref >60)
GFR calc non Af Amer: >60 mL/min (ref >60)
GLUCOSE: 114 mg/dL — AB (ref 65–99)
POTASSIUM: 3.1 mmol/L — AB (ref 3.5–5.1)
SODIUM: 140 mmol/L (ref 135–145)
Total Bilirubin: 1 mg/dL (ref 0.3–1.2)
Total Protein: 9 g/dL — ABNORMAL HIGH (ref 6.5–8.1)

## 2016-05-25 LAB — SALICYLATE LEVEL: Salicylate Lvl: <7 mg/dL (ref 2.8–30.0)

## 2016-05-25 LAB — ACETAMINOPHEN LEVEL: Acetaminophen (Tylenol), Serum: <10 ug/mL — ABNORMAL LOW (ref 10–30)

## 2016-05-25 LAB — ETHANOL: Alcohol, Ethyl (B): <5 mg/dL (ref <5)

## 2016-05-25 MED ORDER — FAMOTIDINE IN NACL 20-0.9 MG/50ML-% IV SOLN
20.0000 mg | Freq: Once | INTRAVENOUS | Status: AC
  Administered 2016-05-25: 20 mg via INTRAVENOUS
  Filled 2016-05-25: qty 50

## 2016-05-25 MED ORDER — ONDANSETRON HCL 4 MG/2ML IJ SOLN
4.0000 mg | Freq: Once | INTRAMUSCULAR | Status: AC
  Administered 2016-05-25: 4 mg via INTRAVENOUS
  Filled 2016-05-25: qty 2

## 2016-05-25 MED ORDER — PANTOPRAZOLE SODIUM 40 MG PO TBEC
40.0000 mg | DELAYED_RELEASE_TABLET | Freq: Every day | ORAL | Status: DC
  Administered 2016-05-26: 40 mg via ORAL
  Filled 2016-05-25: qty 1

## 2016-05-25 MED ORDER — POTASSIUM CHLORIDE CRYS ER 20 MEQ PO TBCR
40.0000 meq | EXTENDED_RELEASE_TABLET | Freq: Once | ORAL | Status: AC
  Administered 2016-05-25: 40 meq via ORAL
  Filled 2016-05-25: qty 2

## 2016-05-25 MED ORDER — SODIUM CHLORIDE 0.9 % IV BOLUS (SEPSIS)
1000.0000 mL | Freq: Once | INTRAVENOUS | Status: AC
  Administered 2016-05-25: 1000 mL via INTRAVENOUS

## 2016-05-25 MED ORDER — NICOTINE 14 MG/24HR TD PT24
14.0000 mg | MEDICATED_PATCH | Freq: Every day | TRANSDERMAL | Status: DC
  Administered 2016-05-26: 14 mg via TRANSDERMAL
  Filled 2016-05-25: qty 1

## 2016-05-25 NOTE — ED Notes (Signed)
Pt states that he is tired of "this" took approximately 30-40 antibiotic tablets, unsure of what kind, in an attempt to kill himself,

## 2016-05-25 NOTE — ED Notes (Signed)
Spoke with Alejandro Conner at poison control who advised to monitor his kidney function, dehydration and perform ekg, Dr Estell HarpinZammit notified,

## 2016-05-25 NOTE — ED Notes (Signed)
Pt incontinent of stool, pants and bed linen changed, warm blanket provided,

## 2016-05-25 NOTE — ED Triage Notes (Addendum)
Took an unknown amount of Clindamycin and two other unknown medications that were his girlfriends medications, not his.  Took them with the intentions of harming self due to his girlfriend breaking up with him, and that she is never coming back.  Took medication 30 minutes prior to EMS arrival at his home.  Patient vomiting constantly enroute to the ER.  IV established enroute by EMS.  Pill fragments visualized in emesis bag.

## 2016-05-25 NOTE — ED Provider Notes (Signed)
[facility]-EMERGENCY DEPT Provider Note   CSN: [id number] Arrival date & time: 05/25/16  2148  By signing my name below, I, Bridgette HabermannMaria Tan, attest that this documentation has been prepared under the direction and in the presence of Bethann BerkshireJoseph Salim Forero, MD. Electronically Signed: Bridgette HabermannMaria Tan, ED Scribe. 05/25/16. 10:24 PM.  History   Chief Complaint Chief Complaint  Patient presents with  . Drug Overdose   HPI Comments: Alejandro HeckBrian Furio is a 45 y.o. male with h/o bell palsy who presents to the Emergency Department by EMS for a possible drug overdose that occurred 1.5 hours ago. Per EMS, pt took an unknown amount of Clindamycin and several other medications that were his girlfriend's. Pt took them with the intentions of harming himself because his girlfriend broke up with him. Pt reports taking "4 different kinds of antibiotics" and states he "probably vomited them all up while in the ambulance". Pt was vomiting en route ("6-7 episodes"); EMS established an IV. Pt reports still feeling nausea and SI; pt stated "I don't want to be high anymore". Pt was previously admitted on 04/06/2016 for an overdose of anti-inflammatory medication. No known fevers at this time.    The history is provided by the patient and the EMS personnel. No language interpreter was used.  Drug Overdose  This is a new problem. The current episode started 1 to 2 hours ago. The problem occurs rarely. The problem has been gradually improving. Pertinent negatives include no chest pain, no abdominal pain and no headaches.    Past Medical History:  Diagnosis Date  . Bell palsy   . Kidney stones     Patient Active Problem List   Diagnosis Date Noted  . Altered mental status 04/07/2016  . Hypokalemia 04/07/2016    Past Surgical History:  Procedure Laterality Date  . ANKLE RECONSTRUCTION         Home Medications    Prior to Admission medications   Medication Sig Start Date End Date Taking? Authorizing Provider  acyclovir (ZOVIRAX)  400 MG tablet Take 1 tablet (400 mg total) by mouth 5 (five) times daily. 04/11/16   [place] SirenPeter Le, MD  cefUROXime (CEFTIN) 500 MG tablet Take 1 tablet (500 mg total) by mouth 2 (two) times daily with a meal. 04/11/16   [place] SirenPeter Le, MD  nicotine (NICODERM CQ - DOSED IN MG/24 HOURS) 14 mg/24hr patch Place 1 patch (14 mg total) onto the skin daily. 04/12/16   [place] SirenPeter Le, MD  pantoprazole (PROTONIX) 40 MG tablet Take 1 tablet (40 mg total) by mouth daily. 04/11/16   [place] SirenPeter Le, MD    Family History No family history on file.  Social History Social History  Substance Use Topics  . Smoking status: Current Every Day Smoker    Packs/day: 1.00    Years: 30.00  . Smokeless tobacco: Never Used  . Alcohol use No     Allergies   Morphine and related   Review of Systems Review of Systems  Constitutional: Negative for appetite change and fatigue.  HENT: Negative for congestion, ear discharge and sinus pressure.   Eyes: Negative for discharge.  Respiratory: Negative for cough.   Cardiovascular: Negative for chest pain.  Gastrointestinal: Positive for nausea. Negative for abdominal pain and diarrhea.  Genitourinary: Negative for frequency and hematuria.  Musculoskeletal: Negative for back pain.  Skin: Negative for rash.  Neurological: Negative for seizures and headaches.  Psychiatric/Behavioral: Positive for suicidal ideas. Negative for hallucinations.     Physical Exam Updated Vital Signs BP 140/89 (BP  Location: Right Arm)   Pulse 94   Resp 14   Ht 5\' 11"  (1.803 m)   Wt 195 lb (88.5 kg)   SpO2 100%   BMI 27.20 kg/m   Physical Exam  Constitutional: He is oriented to person, place, and time. He appears well-developed.  HENT:  Head: Normocephalic.  Eyes: Conjunctivae and EOM are normal. No scleral icterus.  Neck: Neck supple. No thyromegaly present.  Cardiovascular: Normal rate and regular rhythm.  Exam reveals no gallop and no friction rub.   No murmur heard. Pulmonary/Chest: No  stridor. He has no wheezes. He has no rales. He exhibits no tenderness.  Abdominal: He exhibits no distension. There is no tenderness. There is no rebound.  Musculoskeletal: Normal range of motion. He exhibits no edema.  Lymphadenopathy:    He has no cervical adenopathy.  Neurological: He is oriented to person, place, and time. He exhibits normal muscle tone. Coordination normal.  Skin: No rash noted. No erythema.  Psychiatric: He expresses suicidal ideation.     ED Treatments / Results  DIAGNOSTIC STUDIES: Oxygen Saturation is 100% on RA, normal by my interpretation.    COORDINATION OF CARE: 10:23 PM Discussed treatment plan with pt at bedside and pt agreed to plan.  Labs (all labs ordered are listed, but only abnormal results are displayed) Labs Reviewed - No data to display  EKG  EKG Interpretation None       Radiology No results found.  Procedures Procedures (including critical care time)  Medications Ordered in ED Medications - No data to display   Initial Impression / Assessment and Plan / ED Course  I have reviewed the triage vital signs and the nursing notes.  Pertinent labs & imaging results that were available during my care of the patient were reviewed by me and considered in my medical decision making (see chart for details).  Clinical Course     Patient suicidal. He is medically cleared. Psychiatry consult pending  Final Clinical Impressions(s) / ED Diagnoses   Final diagnoses:  None    New Prescriptions New Prescriptions   No medications on file  The chart was scribed for me under my direct supervision.  I personally performed the history, physical, and medical decision making and all procedures in the evaluation of this patient.Bethann Berkshire.     Juwuan Sedita, MD 05/25/16 [id number]

## 2016-05-26 ENCOUNTER — Inpatient Hospital Stay (HOSPITAL_COMMUNITY): Admission: AD | Admit: 2016-05-26 | Payer: Self-pay | Source: Intra-hospital | Admitting: Psychiatry

## 2016-05-26 ENCOUNTER — Inpatient Hospital Stay (HOSPITAL_COMMUNITY)
Admission: AD | Admit: 2016-05-26 | Discharge: 2016-05-30 | DRG: 897 | Disposition: A | Discharge status: Home / Self Care | Payer: No Typology Code available for payment source | Source: Intra-hospital | Attending: Psychiatry | Admitting: Psychiatry

## 2016-05-26 ENCOUNTER — Emergency Department (HOSPITAL_COMMUNITY): Payer: Self-pay

## 2016-05-26 ENCOUNTER — Encounter (HOSPITAL_COMMUNITY): Payer: Self-pay

## 2016-05-26 DIAGNOSIS — F1520 Other stimulant dependence, uncomplicated: Secondary | ICD-10-CM | POA: Diagnosis not present

## 2016-05-26 DIAGNOSIS — F17210 Nicotine dependence, cigarettes, uncomplicated: Secondary | ICD-10-CM | POA: Diagnosis present

## 2016-05-26 DIAGNOSIS — Z888 Allergy status to other drugs, medicaments and biological substances status: Secondary | ICD-10-CM | POA: Diagnosis not present

## 2016-05-26 DIAGNOSIS — E876 Hypokalemia: Secondary | ICD-10-CM | POA: Diagnosis not present

## 2016-05-26 DIAGNOSIS — N400 Enlarged prostate without lower urinary tract symptoms: Secondary | ICD-10-CM | POA: Diagnosis present

## 2016-05-26 DIAGNOSIS — G4700 Insomnia, unspecified: Secondary | ICD-10-CM | POA: Diagnosis present

## 2016-05-26 DIAGNOSIS — Z79899 Other long term (current) drug therapy: Secondary | ICD-10-CM | POA: Diagnosis not present

## 2016-05-26 DIAGNOSIS — F411 Generalized anxiety disorder: Secondary | ICD-10-CM | POA: Diagnosis present

## 2016-05-26 DIAGNOSIS — F332 Major depressive disorder, recurrent severe without psychotic features: Secondary | ICD-10-CM | POA: Diagnosis present

## 2016-05-26 LAB — URINALYSIS, ROUTINE W REFLEX MICROSCOPIC
BACTERIA UA: NONE SEEN
BILIRUBIN URINE: NEGATIVE
GLUCOSE, UA: NEGATIVE mg/dL
Ketones, ur: 20 mg/dL — AB
LEUKOCYTES UA: NEGATIVE
NITRITE: NEGATIVE
PROTEIN: NEGATIVE mg/dL
Specific Gravity, Urine: 1.015 (ref 1.005–1.030)
pH: 5 (ref 5.0–8.0)

## 2016-05-26 LAB — RAPID URINE DRUG SCREEN, HOSP PERFORMED
Amphetamines: POSITIVE — AB
Barbiturates: NOT DETECTED
Benzodiazepines: NOT DETECTED
Cocaine: NOT DETECTED
OPIATES: NOT DETECTED
Tetrahydrocannabinol: POSITIVE — AB

## 2016-05-26 MED ORDER — ALUM & MAG HYDROXIDE-SIMETH 200-200-20 MG/5ML PO SUSP: 30.0000 mL | ORAL | Status: DC | PRN

## 2016-05-26 MED ORDER — MAGNESIUM HYDROXIDE 400 MG/5ML PO SUSP: 30.0000 mL | Freq: Every day | ORAL | Status: DC | PRN

## 2016-05-26 MED ORDER — PNEUMOCOCCAL VAC POLYVALENT 25 MCG/0.5ML IJ INJ
0.5000 mL | INJECTION | INTRAMUSCULAR | Status: AC
  Administered 2016-05-27: 0.5 mL via INTRAMUSCULAR

## 2016-05-26 MED ORDER — TAMSULOSIN HCL 0.4 MG PO CAPS
0.4000 mg | ORAL_CAPSULE | Freq: Every day | ORAL | Status: DC
  Administered 2016-05-27 – 2016-05-30 (×4): 0.4 mg via ORAL
  Filled 2016-05-26 (×4): qty 1
  Filled 2016-05-26: qty 7
  Filled 2016-05-26: qty 1

## 2016-05-26 MED ORDER — HYDROXYZINE HCL 25 MG PO TABS
25.0000 mg | ORAL_TABLET | Freq: Four times a day (QID) | ORAL | Status: DC | PRN
Start: 2016-05-26 — End: 2016-05-30
  Administered 2016-05-27 – 2016-05-29 (×6): 25 mg via ORAL
  Filled 2016-05-26 (×5): qty 1
  Filled 2016-05-26: qty 10
  Filled 2016-05-26: qty 1

## 2016-05-26 MED ORDER — ACETAMINOPHEN 325 MG PO TABS
650.0000 mg | ORAL_TABLET | Freq: Four times a day (QID) | ORAL | Status: DC | PRN
  Administered 2016-05-27: 650 mg via ORAL
  Filled 2016-05-26: qty 2

## 2016-05-26 MED ORDER — NICOTINE 21 MG/24HR TD PT24
21.0000 mg | MEDICATED_PATCH | Freq: Every day | TRANSDERMAL | Status: DC
  Administered 2016-05-27 – 2016-05-30 (×4): 21 mg via TRANSDERMAL
  Filled 2016-05-26 (×6): qty 1

## 2016-05-26 MED ORDER — INFLUENZA VAC SPLIT QUAD 0.5 ML IM SUSY
0.5000 mL | PREFILLED_SYRINGE | INTRAMUSCULAR | Status: AC
  Administered 2016-05-27: 0.5 mL via INTRAMUSCULAR
  Filled 2016-05-26: qty 0.5

## 2016-05-26 MED ORDER — TRAZODONE HCL 50 MG PO TABS
50.0000 mg | ORAL_TABLET | Freq: Every evening | ORAL | Status: DC | PRN
  Administered 2016-05-27 – 2016-05-29 (×4): 50 mg via ORAL
  Filled 2016-05-26: qty 7
  Filled 2016-05-26 (×4): qty 1

## 2016-05-26 NOTE — ED Notes (Signed)
Pt incontinent of stool, new pants given, sitter remains at bedside, pt requesting to speak with edp Dr Manus Gunningancour notified,

## 2016-05-26 NOTE — ED Notes (Signed)
Pt incontinent of large amount of stool, assisted pt with bathing, bed linen and scrubs replaced, sitter remains at bedside,
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
## 2016-05-26 NOTE — BH Assessment (Addendum)
Tele Assessment Note   Alejandro Conner is an 45 y.o. male.  -Clinician reviewed note by Dr. Estell Harpin.  Alejandro Conner is a 45 y.o. male with h/o bell palsy who presents to the Emergency Department by EMS for a possible drug overdose that occurred 1.5 hours ago. Per EMS, pt took an unknown amount of Clindamycin and several other medications that were his girlfriend's. Pt took them with the intentions of harming himself because his girlfriend broke up with him. Pt told Dr. Estell Harpin he was still feeling SI.  Pt was previously admitted on 04-06-16 for an overdose of anti-inflammatory medicine.  Patient currently is denying SI at this time.  He says "I don't want to get high anymore."  Patient is evasive about what his intentions were with the overdose.  He talks about wanting to get off methamphetamines.  Patient admits to another intentional overdose recently.  Patient says he has been having relationship problems w/ girlfriend.  Patient says that when he was out working he came back and girlfriend had left.    Patient says he does use marijuana regularly.  He also has been "eating" methamphetamine.  He has been using 1/4th gram per day for 2-3 days at a time.  Last use was 05-24-16 and he has been using it for 18 months.  Patient denies having an outpatient provider.  He was at a rehab facility in 2003.  -Clinician discussed patient care with Donell Sievert, PA.  Patient meets inpatient care criteria.  No appropriate beds a [facility], TTS to seek placement.  Diagnosis: MDD recurrent severe; amphetamine use d/o severe  Past Medical History:  Past Medical History:  Diagnosis Date  . Bell palsy   . Kidney stones     Past Surgical History:  Procedure Laterality Date  . ANKLE RECONSTRUCTION      Family History: No family history on file.  Social History:  reports that he has been smoking.  He has a 30.00 pack-year smoking history. He has never used smokeless tobacco. He reports that he uses drugs, including  Marijuana and Methamphetamines. He reports that he does not drink alcohol.  Additional Social History:  Alcohol / Drug Use Pain Medications: None Prescriptions: None Over the Counter: Says he uses flowmax  History of alcohol / drug use?: Yes Substance #1 Name of Substance 1: Marijuana 1 - Age of First Use: 45 years of age 39 - Amount (size/oz): Two joints per day 1 - Frequency: Every day 1 - Duration: on-going 1 - Last Use / Amount: 05/24/16  CIWA: CIWA-Ar BP: 120/77 Pulse Rate: 88 COWS:    PATIENT STRENGTHS: (choose at least two) Ability for insight Average or above average intelligence Communication skills  Allergies:  Allergies  Allergen Reactions  . Morphine And Related     Per significant other(Unknown)    Home Medications:  (Not in a hospital admission)  OB/GYN Status:  No LMP for male patient.  General Assessment Data Location of Assessment: [facility] TTS Assessment: In system Is this a Tele or Face-to-Face Assessment?: Tele Assessment Is this an Initial Assessment or a Re-assessment for this encounter?: Initial Assessment Marital status: Divorced Is patient pregnant?: No Pregnancy Status: No Living Arrangements: Alone Can pt return to current living arrangement?: Yes Admission Status: Voluntary Is patient capable of signing voluntary admission?: Yes Referral Source: Self/Family/Friend Insurance type: self pay     Crisis Care Plan Living Arrangements: Alone Name of Psychiatrist: None Name of Therapist: None  Education Status Is patient currently  in school?: No Highest grade of school patient has completed: Associate's degree in business management  Risk to self with the past 6 months Suicidal Ideation: No-Not Currently/Within Last 6 Months Has patient been a risk to self within the past 6 months prior to admission? : No Suicidal Intent: No Has patient had any suicidal intent within the past 6 months prior to admission? : No Is patient at risk for  suicide?: Yes Suicidal Plan?: Yes-Currently Present Has patient had any suicidal plan within the past 6 months prior to admission? : Yes Specify Current Suicidal Plan: Overdose Access to Means: Yes Specify Access to Suicidal Means: Street drugs What has been your use of drugs/alcohol within the last 12 months?: Amphetamines & Marijuana Previous Attempts/Gestures: Yes How many times?: 2 Other Self Harm Risks: None Triggers for Past Attempts: Unpredictable Intentional Self Injurious Behavior: None Family Suicide History: No Recent stressful life event(s): Conflict (Comment), Financial Problems, Legal Issues, Turmoil (Comment) (Conflict with girlfriend) Persecutory voices/beliefs?: Yes Depression: Yes Depression Symptoms: Despondent, Guilt, Feeling worthless/self pity, Loss of interest in usual pleasures, Insomnia Substance abuse history and/or treatment for substance abuse?: Yes Suicide prevention information given to non-admitted patients: Not applicable  Risk to Others within the past 6 months Homicidal Ideation: No Does patient have any lifetime risk of violence toward others beyond the six months prior to admission? : No (Pt says he does not have a violent background.) Thoughts of Harm to Others: Yes-Currently Present Comment - Thoughts of Harm to Others: None currently Current Homicidal Intent: No Current Homicidal Plan: No Access to Homicidal Means: No Identified Victim: No one History of harm to others?: No Assessment of Violence: None Noted Violent Behavior Description: No hx of fights Does patient have access to weapons?: No Criminal Charges Pending?: Yes Describe Pending Criminal Charges: Shoplifting & driving w/ revoked license Does patient have a court date: No Is patient on probation?: No  Psychosis Hallucinations: Auditory, Visual (When high will see shadow people or hear voices) Delusions: None noted  Mental Status Report Appearance/Hygiene: Disheveled, In  scrubs Eye Contact: Good Motor Activity: Freedom of movement, Restlessness Speech: Logical/coherent Level of Consciousness: Alert, Restless Mood: Depressed, Anxious, Despair, Guilty, Helpless, Sad Affect: Anxious, Depressed Anxiety Level: None Thought Processes: Coherent, Relevant Judgement: Impaired Orientation: Person, Place, Time, Situation Obsessive Compulsive Thoughts/Behaviors: None  Cognitive Functioning Concentration: Decreased Memory: Recent Impaired, Remote Intact IQ: Average Insight: Fair Impulse Control: Poor Appetite: Fair Weight Loss:  (Pt will go for days w/o eating when on meth) Weight Gain: 0 Sleep: Decreased Total Hours of Sleep:  (<4H/D for last 4 days) Vegetative Symptoms: None  ADLScreening [facility] Assessment Services) Patient's cognitive ability adequate to safely complete daily activities?: Yes Patient able to express need for assistance with ADLs?: Yes Independently performs ADLs?: Yes (appropriate for developmental age)  Prior Inpatient Therapy Prior Inpatient Therapy: Yes Prior Therapy Dates: 2003 Prior Therapy Facilty/Provider(s): [facility] Reason for Treatment: SA issues  Prior Outpatient Therapy Prior Outpatient Therapy: No Prior Therapy Dates: N/A Prior Therapy Facilty/Provider(s): N/A Reason for Treatment: N/A Does patient have an ACCT team?: No Does patient have Intensive In-House Services?  : No Does patient have [facility] services? : No Does patient have P4CC services?: No  ADL Screening (condition at time of admission) Patient's cognitive ability adequate to safely complete daily activities?: Yes Is the patient deaf or have difficulty hearing?: No Does the patient have difficulty seeing, even when wearing glasses/contacts?: Yes (Pt says he needs to get his eyes checked.) Does  the patient have difficulty concentrating, remembering, or making decisions?: No Patient able to express need for assistance with ADLs?: Yes Does the patient  have difficulty dressing or bathing?: No Independently performs ADLs?: Yes (appropriate for developmental age) Does the patient have difficulty walking or climbing stairs?: No Weakness of Legs: None Weakness of Arms/Hands: None       Abuse/Neglect Assessment (Assessment to be complete while patient is alone) Physical Abuse: Denies Verbal Abuse: Denies Sexual Abuse: Denies Exploitation of patient/patient's resources: Denies Self-Neglect: Denies     Merchant navy officerAdvance Directives (For Healthcare) Does Patient Have a Medical Advance Directive?: No    Additional Information 1:1 In Past 12 Months?: No CIRT Risk: No Elopement Risk: No Does patient have medical clearance?: Yes     Disposition:  Disposition Initial Assessment Completed for this Encounter: Yes Disposition of Patient: Other dispositions Other disposition(s): Other (Comment) (Pt to be reviewed with PA)  Beatriz StallionHarvey, Jewel Mcafee Ray 05/26/2016 2:00 AM

## 2016-05-26 NOTE — ED Notes (Addendum)
Patient is requesting to leave to go pay his rent before [facility] will [facility] sign [facility]. Offered patient to speak with social worker to help him solve this matter. Patient refused. Patient insist he get his clothes to leave. Dr. Clayborne DanaMesner made aware.

## 2016-05-26 NOTE — ED Notes (Signed)
ED Provider at bedside. 

## 2016-05-26 NOTE — ED Notes (Signed)
Pt incontinent of stool, new pants given, sitter remains at bedside,

## 2016-05-26 NOTE — ED Notes (Signed)
Assisted nurse in cleaning patient up from a bowel movement changed bed and cleaned floor.

## 2016-05-26 NOTE — ED Notes (Signed)
Patient ambulatory to restroom with sitter present.  

## 2016-05-26 NOTE — ED Notes (Signed)
[place] poison control called for follow up,

## 2016-05-26 NOTE — ED Notes (Signed)
Patient stating his kidneys are giving him problems. States he has had kidney problems for years. Patient denies pain and fevers. States he feels like his "kidneys are blocked".

## 2016-05-26 NOTE — ED Notes (Signed)
Pt resting supine, eyes closed, no distress noted, sitter remains at bedside,

## 2016-05-26 NOTE — Progress Notes (Signed)
Patient was recommended inpatient treatment by Donell SievertSpencer Simon on 05/26/16.  Patient has been [facility], [facility], [facility], [facility], [facility], [facility].  CSW will continue to seek placement.  Melbourne Abtsatia Anush Wiedeman, LCSWA Disposition staff 05/26/2016 10:23 AM

## 2016-05-26 NOTE — Tx Team (Signed)
Initial Treatment Plan 05/26/2016 11:14 PM Alejandro Conner [id number]    PATIENT STRESSORS: Financial difficulties Substance abuse   PATIENT STRENGTHS: Ability for insight Average or above average intelligence Capable of independent living Communication skills Motivation for treatment/growth Supportive family/friends Work skills   PATIENT IDENTIFIED PROBLEMS: Substance Abuse  "Im ready to get help to quit now"  "I need to learn what makes me do this"    Anxiety  "Im always anxious"  "Im always nervous"         DISCHARGE CRITERIA:  Ability to meet basic life and health needs Adequate post-discharge living arrangements Improved stabilization in mood, thinking, and/or behavior Medical problems require only outpatient monitoring Motivation to continue treatment in a less acute level of care  PRELIMINARY DISCHARGE PLAN: Return to previous living arrangement Return to previous work or school arrangements  PATIENT/FAMILY INVOLVEMENT: This treatment plan has been presented to and reviewed with the patient, Alejandro Conner.  The patient has been given the opportunity to ask questions and make suggestions.  Sylvan CheeseSteven M Deklyn Trachtenberg, RN 05/26/2016, 11:14 PM

## 2016-05-26 NOTE — ED Provider Notes (Signed)
Pt labs show TNTC red blood cells in the urine.  CT scan shows renal stones but no ureteral stones.  Pt is medically stable for psychiatric treatment.  No acute medical treatment needed for the renal stones   Linwood DibblesJon Patrisha Hausmann, MD 05/26/16 1919

## 2016-05-27 DIAGNOSIS — F1520 Other stimulant dependence, uncomplicated: Secondary | ICD-10-CM | POA: Diagnosis present

## 2016-05-27 MED ORDER — DM-GUAIFENESIN ER 30-600 MG PO TB12
1.0000 | ORAL_TABLET | Freq: Two times a day (BID) | ORAL | Status: DC
  Administered 2016-05-27 – 2016-05-30 (×7): 1 via ORAL
  Filled 2016-05-27 (×11): qty 1

## 2016-05-27 NOTE — H&P (Signed)
Psychiatric Admission Assessment Adult  Patient Identification: Alejandro Conner MRN:  [id number] Date of Evaluation:  05/27/2016 Chief Complaint:  MDD RECURRENT SEVERE AMPHETAMINE USE DISORDER,SEVERE Principal Diagnosis: Methamphetamine use disorder, severe ([place]) Diagnosis:   Patient Active Problem List   Diagnosis Date Noted  . Methamphetamine use disorder, severe ([place]) [F15.20] 05/27/2016  . Hypokalemia [E87.6] 04/07/2016   History of Present Illness: Patient presented to the emergency room with what he describes as an "fake" suicide attempt with 3 or 4 antibiotic pills because he needed help to detox from methamphetamine and get treatment for his methamphetamine use disorder. Patient states that he started using methamphetamine about 16 months ago and typically uses every weekend all weekend about $50 worth. He states that he eats the methamphetamine and does not use IV drugs. He reports that he was  "crack head" in the past but has been clean from cocaine for about 8 years. He occasionally smokes marijuana but denies alcohol use or other drug use. DSS on admission was positive for marijuana and amphetamines. Alcohol was negative. Patient reports inability to cut down, craving, using more than in tended and used despite consequences. He reports that he is in danger of losing his job because he often misses work on Monday secondary to being hung over from his methamphetamine binge. He also states his girlfriend is about to leave him and his family has given up on him. In addition "I look like I'm [age] years old." Patient states that he has been to rehabilitation programs last about 10 years ago at a place called "[facility]." He states that currently his father is willing to pay for him to do inpatient rehabilitation in [place] and he is interested in pursuing this option. Patient denies any prior psychiatric history or any significant prior psychiatric symptoms except those related to  methamphetamine use. He reports that he does feel down and depressed after using and that he has in the past felt paranoid or heard voices while high on methamphetamine. He states he is not hearing any voices or experiencing any psychotic symptoms now and does not experience these when he is not high on methamphetamines. Patient's chart had mentioned a history of seizures. The patient denies any chronic seizures or seizure disorder stating that he once had a seizure when he had encephalitis about 6 months ago but that is the extent of it. Patient does have kidney stones. Patient denies any trauma history. Associated Signs/Symptoms: Depression Symptoms:  feelings of worthlessness/guilt, (Hypo) Manic Symptoms:  none Anxiety Symptoms:  none Psychotic Symptoms:  none PTSD Symptoms: Negative Total Time spent with patient: 30 minutes  Past Psychiatric History: Denies except for attending rehabilitation in the past for cocaine use disorder.  Is the patient at risk to self? No.  Has the patient been a risk to self in the past 6 months? No.  Has the patient been a risk to self within the distant past? No.  Is the patient a risk to others? No.  Has the patient been a risk to others in the past 6 months? No.  Has the patient been a risk to others within the distant past? No.   Prior Inpatient Therapy:  rehab for cocaine Prior Outpatient Therapy:  no  Alcohol Screening: Patient refused Alcohol Screening Tool: Yes 1. How often do you have a drink containing alcohol?: Never 9. Have you or someone else been injured as a result of your drinking?: No 10. Has a relative or friend or a  doctor or another health worker been concerned about your drinking or suggested you cut down?: No Alcohol Use Disorder Identification Test Final Score (AUDIT): 0 Brief Intervention: Patient declined brief intervention Substance Abuse History in the last 12 months:  Yes.   Consequences of Substance Abuse: Medical  Consequences:  Is aware that use may worsen his kidney stones but continues to use Legal Consequences:  Currently has a failure to appear warranted he thinks as he did not go to court to deal with the driving with license revoked charge. Patient reports this was accumulated tickets and denies any history of DWI Family Consequences:  Reports that family is estranged and his girlfriend is about to leave him May lose his job secondary to his use Previous Psychotropic Medications: No  Psychological Evaluations: No  Past Medical History:  Past Medical History:  Diagnosis Date  . Bell palsy   . Kidney stones     Past Surgical History:  Procedure Laterality Date  . ANKLE RECONSTRUCTION     Family History: History reviewed. No pertinent family history. Family Psychiatric  History: Reports one son uses heroin Tobacco Screening: Have you used any form of tobacco in the last 30 days? (Cigarettes, Smokeless Tobacco, Cigars, and/or Pipes): Yes Tobacco use, Select all that apply: 5 or more cigarettes per day Are you interested in Tobacco Cessation Medications?: No, patient refused Counseled patient on smoking cessation including recognizing danger situations, developing coping skills and basic information about quitting provided: Refused/Declined practical counseling Social History:  History  Alcohol Use No     History  Drug Use  . Types: Marijuana, Methamphetamines    Comment: unsure of how recently    Additional Social History: Marital status: Divorced Divorced, when?: 3 times total.  Most recent 2010 What types of issues is patient dealing with in the relationship?: tension due to drug use Are you sexually active?: Yes What is your sexual orientation?: heterosexual Has your sexual activity been affected by drugs, alcohol, medication, or emotional stress?: yes Does patient have children?: Yes How many children?: 3 How is patient's relationship with their children?: pt does not see any of  his children frequently    Pain Medications: None Prescriptions: None Over the Counter: Says he uses flowmax  History of alcohol / drug use?: Yes Name of Substance 1: Marijuana 1 - Age of First Use: 45 years of age 61 - Amount (size/oz): Two joints per day 1 - Frequency: Every day 1 - Duration: on-going 1 - Last Use / Amount: 05/24/16                  Allergies:   Allergies  Allergen Reactions  . Morphine And Related     Per significant other(Unknown)   Lab Results:  Results for orders placed or performed during the hospital encounter of 05/25/16 (from the past 48 hour(s))  Rapid urine drug screen (hospital performed)     Status: Abnormal   Collection Time: 05/25/16 10:20 PM  Result Value Ref Range   Opiates NONE DETECTED NONE DETECTED   Cocaine NONE DETECTED NONE DETECTED   Benzodiazepines NONE DETECTED NONE DETECTED   Amphetamines POSITIVE (A) NONE DETECTED   Tetrahydrocannabinol POSITIVE (A) NONE DETECTED   Barbiturates NONE DETECTED NONE DETECTED    Comment:        DRUG SCREEN FOR MEDICAL PURPOSES ONLY.  IF CONFIRMATION IS NEEDED FOR ANY PURPOSE, NOTIFY LAB WITHIN 5 DAYS.        LOWEST DETECTABLE LIMITS FOR URINE DRUG  SCREEN Drug Class       Cutoff (ng/mL) Amphetamine      1000 Barbiturate      200 Benzodiazepine   676 Tricyclics       720 Opiates          300 Cocaine          300 THC              50   CBC with Differential/Platelet     Status: Abnormal   Collection Time: 05/25/16 10:26 PM  Result Value Ref Range   WBC 12.2 (H) 4.0 - 10.5 K/uL   RBC 5.50 4.22 - 5.81 MIL/uL   Hemoglobin 17.5 (H) 13.0 - 17.0 g/dL   HCT 49.9 39.0 - 52.0 %   MCV 90.7 78.0 - 100.0 fL   MCH 31.8 26.0 - 34.0 pg   MCHC 35.1 30.0 - 36.0 g/dL   RDW 13.6 11.5 - 15.5 %   Platelets 214 150 - 400 K/uL   Neutrophils Relative % 73 %   Neutro Abs 9.0 (H) 1.7 - 7.7 K/uL   Lymphocytes Relative 15 %   Lymphs Abs 1.8 0.7 - 4.0 K/uL   Monocytes Relative 10 %   Monocytes Absolute  1.2 (H) 0.1 - 1.0 K/uL   Eosinophils Relative 2 %   Eosinophils Absolute 0.2 0.0 - 0.7 K/uL   Basophils Relative 0 %   Basophils Absolute 0.0 0.0 - 0.1 K/uL  Comprehensive metabolic panel     Status: Abnormal   Collection Time: 05/25/16 10:26 PM  Result Value Ref Range   Sodium 140 135 - 145 mmol/L   Potassium 3.1 (L) 3.5 - 5.1 mmol/L   Chloride 105 101 - 111 mmol/L   CO2 23 22 - 32 mmol/L   Glucose, Bld 114 (H) 65 - 99 mg/dL   BUN 13 6 - 20 mg/dL   Creatinine, Ser 1.02 0.61 - 1.24 mg/dL   Calcium 10.3 8.9 - 10.3 mg/dL   Total Protein 9.0 (H) 6.5 - 8.1 g/dL   Albumin 5.1 (H) 3.5 - 5.0 g/dL   AST 32 15 - 41 U/L   ALT 22 17 - 63 U/L   Alkaline Phosphatase 86 38 - 126 U/L   Total Bilirubin 1.0 0.3 - 1.2 mg/dL   GFR calc non Af Amer >60 >60 mL/min   GFR calc Af Amer >60 >60 mL/min    Comment: (NOTE) The eGFR has been calculated using the CKD EPI equation. This calculation has not been validated in all clinical situations. eGFR's persistently <60 mL/min signify possible Chronic Kidney Disease.    Anion gap 12 5 - 15  Acetaminophen level     Status: Abnormal   Collection Time: 05/25/16 10:26 PM  Result Value Ref Range   Acetaminophen (Tylenol), Serum <10 (L) 10 - 30 ug/mL    Comment:        THERAPEUTIC CONCENTRATIONS VARY SIGNIFICANTLY. A RANGE OF 10-30 ug/mL MAY BE AN EFFECTIVE CONCENTRATION FOR MANY PATIENTS. HOWEVER, SOME ARE BEST TREATED AT CONCENTRATIONS OUTSIDE THIS RANGE. ACETAMINOPHEN CONCENTRATIONS >150 ug/mL AT 4 HOURS AFTER INGESTION AND >50 ug/mL AT 12 HOURS AFTER INGESTION ARE OFTEN ASSOCIATED WITH TOXIC REACTIONS.   Ethanol     Status: None   Collection Time: 05/25/16 10:26 PM  Result Value Ref Range   Alcohol, Ethyl (B) <5 <5 mg/dL    Comment:        LOWEST DETECTABLE LIMIT FOR SERUM ALCOHOL IS 5 mg/dL FOR MEDICAL PURPOSES  ONLY   Salicylate level     Status: None   Collection Time: 05/25/16 10:26 PM  Result Value Ref Range   Salicylate Lvl <2.8  2.8 - 30.0 mg/dL  Urinalysis, Routine w reflex microscopic     Status: Abnormal   Collection Time: 05/26/16  1:40 PM  Result Value Ref Range   Color, Urine YELLOW YELLOW   APPearance HAZY (A) CLEAR   Specific Gravity, Urine 1.015 1.005 - 1.030   pH 5.0 5.0 - 8.0   Glucose, UA NEGATIVE NEGATIVE mg/dL   Hgb urine dipstick MODERATE (A) NEGATIVE   Bilirubin Urine NEGATIVE NEGATIVE   Ketones, ur 20 (A) NEGATIVE mg/dL   Protein, ur NEGATIVE NEGATIVE mg/dL   Nitrite NEGATIVE NEGATIVE   Leukocytes, UA NEGATIVE NEGATIVE   RBC / HPF TOO NUMEROUS TO COUNT 0 - 5 RBC/hpf   WBC, UA 6-30 0 - 5 WBC/hpf   Bacteria, UA NONE SEEN NONE SEEN   Mucous PRESENT    Hyaline Casts, UA PRESENT     Blood Alcohol level:  Lab Results  Component Value Date   ETH <5 05/25/2016   ETH 8 (H) 63/81/7711    Metabolic Disorder Labs:  No results found for: HGBA1C, MPG No results found for: PROLACTIN No results found for: CHOL, TRIG, HDL, CHOLHDL, VLDL, LDLCALC  Current Medications: Current Facility-Administered Medications  Medication Dose Route Frequency Provider Last Rate Last Dose  . acetaminophen (TYLENOL) tablet 650 mg  650 mg Oral Q6H PRN Ethelene Hal, NP   650 mg at 05/27/16 6579  . alum & mag hydroxide-simeth (MAALOX/MYLANTA) 200-200-20 MG/5ML suspension 30 mL  30 mL Oral Q4H PRN Ethelene Hal, NP      . dextromethorphan-guaiFENesin [facility] DM) 30-600 MG per 12 hr tablet 1 tablet  1 tablet Oral BID Kerrie Buffalo, NP   1 tablet at 05/27/16 0959  . hydrOXYzine (ATARAX/VISTARIL) tablet 25 mg  25 mg Oral Q6H PRN Ethelene Hal, NP   25 mg at 05/27/16 0837  . magnesium hydroxide (MILK OF MAGNESIA) suspension 30 mL  30 mL Oral Daily PRN Ethelene Hal, NP      . nicotine (NICODERM CQ - dosed in mg/24 hours) patch 21 mg  21 mg Transdermal Daily Rozetta Nunnery, NP   21 mg at 05/27/16 0835  . tamsulosin (FLOMAX) capsule 0.4 mg  0.4 mg Oral Daily Ethelene Hal, NP   0.4 mg at  05/27/16 0836  . traZODone (DESYREL) tablet 50 mg  50 mg Oral QHS PRN Ethelene Hal, NP   50 mg at 05/27/16 0101   PTA Medications: Prescriptions Prior to Admission  Medication Sig Dispense Refill Last Dose  . nicotine (NICODERM CQ - DOSED IN MG/24 HOURS) 14 mg/24hr patch Place 1 patch (14 mg total) onto the skin daily. (Patient not taking: Reported on 05/26/2016) 28 patch 0 Not Taking at Unknown time  . pantoprazole (PROTONIX) 40 MG tablet Take 1 tablet (40 mg total) by mouth daily. (Patient not taking: Reported on 05/26/2016) 30 tablet 0 Not Taking at Unknown time  . tamsulosin (FLOMAX) 0.4 MG CAPS capsule Take 0.4 mg by mouth daily.   05/25/2016 at Unknown time    Musculoskeletal: Strength & Muscle Tone: within normal limits Gait & Station: normal Patient leans: N/A  Psychiatric Specialty Exam: Physical Exam unremarkable   ROS noncontributory   Blood pressure 109/84, pulse 98, temperature 98 F (36.7 C), temperature source Oral, resp. rate 18, height 5' 11" (1.803 m),  weight 86.6 kg (191 lb), SpO2 100 %.Body mass index is 26.64 kg/m.  General Appearance: Casual  Eye Contact:  Good  Speech:  Clear and Coherent  Volume:  Normal  Mood:  Anxious  Affect:  Congruent  Thought Process:  Coherent  Orientation:  Full (Time, Place, and Person)  Thought Content:  Negative  Suicidal Thoughts:  No  Homicidal Thoughts:  No  Memory:  Negative  Judgement:  Fair  Insight:  Present  Psychomotor Activity:  Normal  Concentration:  Concentration: Good  Recall:  Good  Fund of Knowledge:  Good  Language:  Good  Akathisia:  No  Handed:  Right  AIMS (if indicated):     Assets:  Resilience  ADL's:  Intact  Cognition:  WNL  Sleep:  Number of Hours: 6.25    Treatment Plan Summary: Daily contact with patient to assess and evaluate symptoms and progress in treatment and Patient will meet with social worker to arrange for needed follow-up for methamphetamine use disorder.  Observation  Level/Precautions:  15 minute checks  Laboratory:  see labs  Psychotherapy:    Medications:    Consultations:    Discharge Concerns:    Estimated LOS:  Other:     Physician Treatment Plan for Primary Diagnosis: Methamphetamine use disorder, severe ([place]) Long Term Goal(s): Improvement in symptoms so as ready for discharge  Short Term Goals: Ability to demonstrate self-control will improve  Physician Treatment Plan for Secondary Diagnosis: Principal Problem:   Methamphetamine use disorder, severe ([place])  Long Term Goal(s): Improvement in symptoms so as ready for discharge  Short Term Goals: Ability to identify changes in lifestyle to reduce recurrence of condition will improve and Ability to identify triggers associated with substance abuse/mental health issues will improve  I certify that inpatient services furnished can reasonably be expected to improve the patient's condition.    Linard Millers, MD 12/15/201710:07 AM

## 2016-05-27 NOTE — Progress Notes (Signed)
Patient ID: Alejandro Conner, male   DOB: 1970-09-27, 45 y.o.   MRN: [id number]  D: Patient observed sleeping most of the evening. Pt stated he he has not slept for about a week. Pt c/o about pain from chronic kidney stones but refused medication. Pt mood and affect appeared depressed and flat. Denies SI/HI/AVH.No behavioral issues noted.  A: Support and encouragement offered as needed. Medications administered as prescribed.  R: Patient safe and cooperative on the unit. Will continue to monitor patient for safety and stability.

## 2016-05-27 NOTE — Progress Notes (Signed)
Pt did not attend [facility] psycho-educational group. Pt remained in bed.

## 2016-05-27 NOTE — Progress Notes (Signed)
Patient ID: Alejandro Conner, male   DOB: 05/03/1971, 45 y.o.   MRN: [id number]  DAR: Pt. Denies SI/HI and A/V Hallucinations. Patient does report a headache and lower back "kidney" pain. He received PRN Tylenol but no relief. Support and encouragement provided to the patient to come to writer if he has questions or concerns. He reports, "I want to sleep, I haven't slept in 6 days." He spends most of the day in bed but does come to the medication window for meds and attends meals in the cafeteria. Patient is minimal, with depressed affect and mood. Q15 minute checks are maintained for safety.

## 2016-05-27 NOTE — Progress Notes (Signed)
Patient attended AA group meeting.  

## 2016-05-27 NOTE — [facility] Suicide Risk Assessment (Signed)
[facility]   Nursing information obtained from:    Demographic factors:    Current Mental Status:    Loss Factors:    Historical Factors:    Risk Reduction Factors:     Total Time spent with patient: 30 minutes Principal Problem: Methamphetamine use disorder, severe ([facility]) Diagnosis:   Patient Active Problem List   Diagnosis Date Noted  . Methamphetamine use disorder, severe ([facility]) [F15.20] 05/27/2016  . Hypokalemia [E87.6] 04/07/2016   Subjective Data: Patient denies any suicidal or homicidal ideation, plan or intent.  Continued Clinical Symptoms:  Alcohol Use Disorder Identification Test Final Score (AUDIT): 0 The "Alcohol Use Disorders Identification Test", Guidelines for Use in Primary Care, Second Edition.  World [employer] [facility](WHO). Score between 0-7:  no or low risk or alcohol related problems. Score between 8-15:  moderate risk of alcohol related problems. Score between 16-19:  high risk of alcohol related problems. Score 20 or above:  warrants further diagnostic evaluation for alcohol dependence and treatment.   CLINICAL FACTORS:   Alcohol/Substance Abuse/Dependencies   Musculoskeletal: Strength & Muscle Tone: within normal limits Gait & Station: normal Patient leans: N/A  Psychiatric Specialty Exam: Physical Exam  ROS  Blood pressure 109/84, pulse 98, temperature 98 F (36.7 C), temperature source Oral, resp. rate 18, height 5\' 11"  (1.803 m), weight 86.6 kg (191 lb), SpO2 100 %.Body mass index is 26.64 kg/m.   General Appearance: Casual  Eye Contact:  Good  Speech:  Clear and Coherent  Volume:  Normal  Mood:  Anxious  Affect:  Congruent  Thought Process:  Coherent  Orientation:  Full (Time, Place, and Person)  Thought Content:  Negative  Suicidal Thoughts:  No  Homicidal Thoughts:  No  Memory:  Negative  Judgement:  Fair  Insight:  Present  Psychomotor Activity:  Normal  Concentration:  Concentration: Good  Recall:   Good  Fund of Knowledge:  Good  Language:  Good  Akathisia:  No  Handed:  Right  AIMS (if indicated):     Assets:  Resilience  ADL's:  Intact  Cognition:  WNL  Sleep:  Number of Hours: 6.25      COGNITIVE FEATURES THAT CONTRIBUTE TO RISK:  None    SUICIDE RISK:   Minimal: No identifiable suicidal ideation.  Patients presenting with no risk factors but with morbid ruminations; may be classified as minimal risk based on the severity of the depressive symptoms   PLAN OF CARE: see PAA  I certify that inpatient services furnished can reasonably be expected to improve the patient's condition.  Acquanetta SitElizabeth Woods Oates, MD 05/27/2016, 10:20 AM

## 2016-05-27 NOTE — [facility] Group Notes (Signed)
[facility] LCSW Group Therapy 05/27/2016  1:15 PM   Type of Therapy: Group Therapy  Participation Level: Did Not Attend. Patient invited to participate but declined.   Alastor Kneale, MSW, LCSW Clinical Social Worker [facility] [phone]   
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
## 2016-05-27 NOTE — Tx Team (Cosign Needed)
Interdisciplinary Treatment and Diagnostic Plan Update  05/27/2016 Time of Session: 0930 Alejandro Conner MRN: [id number]  Principal Diagnosis: Methamphetamine use disorder, severe ([place])  Secondary Diagnoses: Principal Problem:   Methamphetamine use disorder, severe ([place])   Current Medications:  Current Facility-Administered Medications  Medication Dose Route Frequency Provider Last Rate Last Dose  . acetaminophen (TYLENOL) tablet 650 mg  650 mg Oral Q6H PRN Ethelene Hal, NP   650 mg at 05/27/16 0938  . alum & mag hydroxide-simeth (MAALOX/MYLANTA) 200-200-20 MG/5ML suspension 30 mL  30 mL Oral Q4H PRN Ethelene Hal, NP      . dextromethorphan-guaiFENesin [facility] DM) 30-600 MG per 12 hr tablet 1 tablet  1 tablet Oral BID Kerrie Buffalo, NP   1 tablet at 05/27/16 0959  . hydrOXYzine (ATARAX/VISTARIL) tablet 25 mg  25 mg Oral Q6H PRN Ethelene Hal, NP   25 mg at 05/27/16 0837  . magnesium hydroxide (MILK OF MAGNESIA) suspension 30 mL  30 mL Oral Daily PRN Ethelene Hal, NP      . nicotine (NICODERM CQ - dosed in mg/24 hours) patch 21 mg  21 mg Transdermal Daily Rozetta Nunnery, NP   21 mg at 05/27/16 0835  . tamsulosin (FLOMAX) capsule 0.4 mg  0.4 mg Oral Daily Ethelene Hal, NP   0.4 mg at 05/27/16 0836  . traZODone (DESYREL) tablet 50 mg  50 mg Oral QHS PRN Ethelene Hal, NP   50 mg at 05/27/16 0101   PTA Medications: Prescriptions Prior to Admission  Medication Sig Dispense Refill Last Dose  . nicotine (NICODERM CQ - DOSED IN MG/24 HOURS) 14 mg/24hr patch Place 1 patch (14 mg total) onto the skin daily. (Patient not taking: Reported on 05/26/2016) 28 patch 0 Not Taking at Unknown time  . pantoprazole (PROTONIX) 40 MG tablet Take 1 tablet (40 mg total) by mouth daily. (Patient not taking: Reported on 05/26/2016) 30 tablet 0 Not Taking at Unknown time  . tamsulosin (FLOMAX) 0.4 MG CAPS capsule Take 0.4 mg by mouth daily.   05/25/2016 at Unknown time     Patient Stressors: Financial difficulties Substance abuse  Patient Strengths: Ability for insight Average or above average intelligence Capable of independent living Agricultural engineer for treatment/growth Supportive family/friends Work skills  Treatment Modalities: Medication Management, Group therapy, Case management,  1 to 1 session with clinician, Psychoeducation, Recreational therapy.   Physician Treatment Plan for Primary Diagnosis: Methamphetamine use disorder, severe ([place]) Long Term Goal(s): Improvement in symptoms so as ready for discharge Improvement in symptoms so as ready for discharge   Short Term Goals: Ability to demonstrate self-control will improve Ability to identify changes in lifestyle to reduce recurrence of condition will improve Ability to identify triggers associated with substance abuse/mental health issues will improve  Medication Management: Evaluate patient's response, side effects, and tolerance of medication regimen.  Therapeutic Interventions: 1 to 1 sessions, Unit Group sessions and Medication administration.  Evaluation of Outcomes: Not Met  Physician Treatment Plan for Secondary Diagnosis: Principal Problem:   Methamphetamine use disorder, severe ([place])  Long Term Goal(s): Improvement in symptoms so as ready for discharge Improvement in symptoms so as ready for discharge   Short Term Goals: Ability to demonstrate self-control will improve Ability to identify changes in lifestyle to reduce recurrence of condition will improve Ability to identify triggers associated with substance abuse/mental health issues will improve     Medication Management: Evaluate patient's response, side effects, and tolerance of medication regimen.  Therapeutic Interventions: 1 to 1 sessions, Unit Group sessions and Medication administration.  Evaluation of Outcomes: Not Met   RN Treatment Plan for Primary Diagnosis: Methamphetamine use  disorder, severe ([place]) Long Term Goal(s): Knowledge of disease and therapeutic regimen to maintain health will improve  Short Term Goals: Ability to demonstrate self-control, Ability to identify and develop effective coping behaviors will improve and Compliance with prescribed medications will improve  Medication Management: RN will administer medications as ordered by provider, will assess and evaluate patient's response and provide education to patient for prescribed medication. RN will report any adverse and/or side effects to prescribing provider.  Therapeutic Interventions: 1 on 1 counseling sessions, Psychoeducation, Medication administration, Evaluate responses to treatment, Monitor vital signs and CBGs as ordered, Perform/monitor CIWA, COWS, AIMS and Fall Risk screenings as ordered, Perform wound care treatments as ordered.  Evaluation of Outcomes: Not Met   LCSW Treatment Plan for Primary Diagnosis: Methamphetamine use disorder, severe ([place]) Long Term Goal(s): Safe transition to appropriate next level of care at discharge, Engage patient in therapeutic group addressing interpersonal concerns.  Short Term Goals: Engage patient in aftercare planning with referrals and resources, Facilitate patient progression through stages of change regarding substance use diagnoses and concerns and Increase skills for wellness and recovery  Therapeutic Interventions: Assess for all discharge needs, 1 to 1 time with Social worker, Explore available resources and support systems, Assess for adequacy in community support network, Educate family and significant other(s) on suicide prevention, Complete Psychosocial Assessment, Interpersonal group therapy.  Evaluation of Outcomes: Not Met   Progress in Treatment: Attending groups: No.New admit. Participating in groups: No. Taking medication as prescribed: Yes. Toleration medication: Yes. Family/Significant other contact made: No, will contact:   girlfriend Patient understands diagnosis: Yes. Discussing patient identified problems/goals with staff: Yes. Medical problems stabilized or resolved: Yes. Denies suicidal/homicidal ideation: Yes. Issues/concerns per patient self-inventory: Yes. Other: None  New problem(s) identified: No, Describe:  none reported  New Short Term/Long Term Goal(s):no  Discharge Plan or Barriers: Pt interested in attending residential treatment in [place].  Reason for Continuation of Hospitalization: Depression Other; describe substance use  Estimated Length of Stay:3-5 days  Attendees: Patient: 05/27/2016 10:23 AM  Physician: Dr Sharolyn Douglas 05/27/2016 10:23 AM  Nursing: Hildred Laser, Christa, RN 05/27/2016 10:23 AM  RN Care Manager: 05/27/2016 10:23 AM  Social Worker: Clayton Lefort 05/27/2016 10:23 AM  Recreational Therapist:  05/27/2016 10:23 AM  Other: Lindell Spar, NP, Calton Golds NP 05/27/2016 10:23 AM  Other:  05/27/2016 10:23 AM  Other: 05/27/2016 10:23 AM    Scribe for Treatment Team: Joanne Chars, Counselor 05/27/2016 10:23 AM

## 2016-05-27 NOTE — Progress Notes (Signed)
Recreation Therapy Notes  Date: 05/27/16 Time: 0930 Location: [address] Dayroom  Group Topic: Stress Management  Goal Area(s) Addresses:  Patient will verbalize importance of using healthy stress management.  Patient will identify positive emotions associated with healthy stress management.   Intervention: Stress Management  Activity :  Progressive muscle relaxation.  LRT introduced the stress management technique of progressive muscle relaxation.  LRT read a script which allowed patients to engage in the activity by tensing up each muscle group individually and then relaxing.  Patients were to follow along as LRT read script to fully participate in the technique.  Education:  Stress Management, Discharge Planning.   Education Outcome: Acknowledges edcuation/In group clarification offered/Needs additional education  Clinical Observations/Feedback: Pt did not attend group.   Caroll RancherMarjette Kayline Sheer, LRT/CTRS         Lillia AbedLindsay, Graceyn Fodor A 05/27/2016 11:12 AM

## 2016-05-28 DIAGNOSIS — Z79899 Other long term (current) drug therapy: Secondary | ICD-10-CM

## 2016-05-28 DIAGNOSIS — Z888 Allergy status to other drugs, medicaments and biological substances status: Secondary | ICD-10-CM

## 2016-05-28 DIAGNOSIS — F1520 Other stimulant dependence, uncomplicated: Principal | ICD-10-CM

## 2016-05-28 LAB — TSH: TSH: 0.393 u[IU]/mL (ref 0.350–4.500)

## 2016-05-28 LAB — VITAMIN B12: VITAMIN B 12: 258 pg/mL (ref 180–914)

## 2016-05-28 MED ORDER — IBUPROFEN 800 MG PO TABS
800.0000 mg | ORAL_TABLET | Freq: Three times a day (TID) | ORAL | Status: DC | PRN
Start: 2016-05-28 — End: 2016-05-30
  Administered 2016-05-28 – 2016-05-29 (×2): 800 mg via ORAL
  Filled 2016-05-28 (×2): qty 1

## 2016-05-28 NOTE — BHH Group Notes (Signed)
BHH Group Notes: (Clinical Social Work)   05/28/2016      Type of Therapy:  Group Therapy   Participation Level:  Did Not Attend despite MHT prompting   Ambrose MantleMareida Grossman-Orr, LCSW 05/28/2016, 12:35 PM

## 2016-05-28 NOTE — Progress Notes (Signed)
Pt did not attend AA group meeting. 

## 2016-05-28 NOTE — Progress Notes (Signed)
D.  Pt in bed on approach, has cold and not feeling well.  Pt did not feel well enough to attend evening AA group.  Pt has remained in bed, observed with appropriate interaction with roommate.  Pt denies SI/HI/hallucinations at this time.  A.  Support and encouragement offered, medication given as ordered  R.  Pt remains safe on the unit, will continue to monitor.

## 2016-05-28 NOTE — Progress Notes (Addendum)
DAR Note: Alejandro Conner has been in the bed much of the day.  He would come out for meals but did not attend group today.  He c/o pain from kidney stones and tylenol was ineffective.  New orders obtained for Ibuprofen and higher dose of tylenol but he has not requested any at this time.  He completed his self inventory and reported that his depression was 5/10, hopelessness was 0/10 and anxiety was 7/10.  He denies suicidal thoughts.  His goal for today is "another day clean" and he will accomplish this goal by "not doing drugs."  Encouraged participation in group and unit activities.  Q 15 minute checks maintained for safety.  We will continue to monitor the progress towards his goals.

## 2016-05-28 NOTE — Progress Notes (Signed)
[facility]  05/28/2016 3:03 PM Alejandro Conner  MRN:  [id number] Subjective:  Patient c/o of "kidney stone pain.   Objective:  Pt labs show TNTC red blood cells in the urine.  CT scan shows renal stones but no ureteral stones.  Pt is medically stable for psychiatric treatment.  No acute medical treatment needed for the renal stones Linwood Dibbles(Jon Knapp, ER MD).  Given Ibuprofen 800 mg to help with "kidney pain."  Patient was asleep in his bed when this writer saw him.  He denies SI HI AVH.  Principal Problem: Methamphetamine use disorder, severe (H[facility]) Diagnosis:   Patient Active Problem List   Diagnosis Date Noted  . Methamphetamine use disorder, severe ([facility]CC) [F15.20] 05/27/2016  . Hypokalemia [E87.6] 04/07/2016   Total Time spent with patient: 30 minutes  Past Psychiatric History:  See HPI  Past Medical History:  Past Medical History:  Diagnosis Date  . Bell palsy   . Kidney stones     Past Surgical History:  Procedure Laterality Date  . ANKLE RECONSTRUCTION     Family History: History reviewed. No pertinent family history. Family Psychiatric  History: see HPI Social History:  History  Alcohol Use No     History  Drug Use  . Types: Marijuana, Methamphetamines    Comment: unsure of how recently    Social History   Social History  . Marital status: Significant Other    Spouse name: N/A  . Number of children: N/A  . Years of education: N/A   Occupational History  . painter    Social History Main Topics  . Smoking status: Current Every Day Smoker    Packs/day: 1.00    Years: 30.00  . Smokeless tobacco: Never Used  . Alcohol use No  . Drug use:     Types: Marijuana, Methamphetamines     Comment: unsure of how recently  . Sexual activity: Not Asked   Other Topics Concern  . None   Social History Narrative  . None   Additional Social History:    Pain Medications: None Prescriptions: None Over the Counter: Says he uses flowmax  History of alcohol / drug  use?: Yes Name of Substance 1: Marijuana 1 - Age of First Use: 45 years of age 62 - Amount (size/oz): Two joints per day 1 - Frequency: Every day 1 - Duration: on-going 1 - Last Use / Amount: 05/24/16    Sleep: Good  Appetite:  Good  Current Medications: Current Facility-Administered Medications  Medication Dose Route Frequency Provider Last Rate Last Dose  . acetaminophen (TYLENOL) tablet 650 mg  650 mg Oral Q6H PRN Laveda AbbeLaurie Britton Parks, NP   650 mg at 05/27/16 [id number]  . alum & mag hydroxide-simeth (MAALOX/MYLANTA) 200-200-20 MG/5ML suspension 30 mL  30 mL Oral Q4H PRN Laveda AbbeLaurie Britton Parks, NP      . dextromethorphan-guaiFENesin [facility](MUCINEX DM) 30-600 MG per 12 hr tablet 1 tablet  1 tablet Oral BID Adonis BrookSheila Velicia Dejager, NP   1 tablet at 05/28/16 0904  . hydrOXYzine (ATARAX/VISTARIL) tablet 25 mg  25 mg Oral Q6H PRN Laveda AbbeLaurie Britton Parks, NP   25 mg at 05/28/16 0904  . ibuprofen (ADVIL,MOTRIN) tablet 800 mg  800 mg Oral Q8H PRN Adonis BrookSheila Tiahna Cure, NP      . magnesium hydroxide (MILK OF MAGNESIA) suspension 30 mL  30 mL Oral Daily PRN Laveda AbbeLaurie Britton Parks, NP      . nicotine (NICODERM CQ - dosed in mg/24 hours) patch 21 mg  21  mg Transdermal Daily Jackelyn PolingJason A Berry, NP   21 mg at 05/28/16 0904  . tamsulosin (FLOMAX) capsule 0.4 mg  0.4 mg Oral Daily Laveda AbbeLaurie Britton Parks, NP   0.4 mg at 05/28/16 0904  . traZODone (DESYREL) tablet 50 mg  50 mg Oral QHS PRN Laveda AbbeLaurie Britton Parks, NP   50 mg at 05/27/16 2220    Lab Results:  Results for orders placed or performed during the hospital encounter of 05/26/16 (from the past 48 hour(s))  TSH     Status: None   Collection Time: 05/28/16  6:44 AM  Result Value Ref Range   TSH 0.393 0.350 - 4.500 uIU/mL    Comment: Performed by a 3rd Generation assay with a functional sensitivity of <=0.01 uIU/mL. Performed at [facility]   Vitamin B12     Status: None   Collection Time: 05/28/16  6:44 AM  Result Value Ref Range   Vitamin B-12 258 180 - 914  pg/mL    Comment: (NOTE) This assay is not validated for testing neonatal or myeloproliferative syndrome specimens for Vitamin B12 levels. Performed at [facility]     Blood Alcohol level:  Lab Results  Component Value Date   [facility] <5 05/25/2016   ETH 8 (H) 04/06/2016    Metabolic Disorder Labs: No results found for: HGBA1C, MPG No results found for: PROLACTIN No results found for: CHOL, TRIG, HDL, CHOLHDL, VLDL, LDLCALC  Physical Findings: AIMS: Facial and Oral Movements Muscles of Facial Expression: None, normal Lips and Perioral Area: None, normal Jaw: None, normal Tongue: None, normal,Extremity Movements Upper (arms, wrists, hands, fingers): None, normal Lower (legs, knees, ankles, toes): None, normal, Trunk Movements Neck, shoulders, hips: None, normal, Overall Severity Severity of abnormal movements (highest score from questions above): None, normal Incapacitation due to abnormal movements: None, normal Patient's awareness of abnormal movements (rate only patient's report): No Awareness, Dental Status Current problems with teeth and/or dentures?: No Does patient usually wear dentures?: No  CIWA:    COWS:     Musculoskeletal: Strength & Muscle Tone: within normal limits Gait & Station: normal Patient leans: N/A  Psychiatric Specialty Exam: Physical Exam  Nursing note and vitals reviewed.   ROS  Blood pressure 119/86, pulse (!) 107, temperature 98.3 F (36.8 C), resp. rate 20, height 5\' 11"  (1.803 m), weight 86.6 kg (191 lb), SpO2 100 %.Body mass index is 26.64 kg/m.   General Appearance:Casual  Eye Contact: Good  Speech: Clear and Coherent  Volume: Normal  Mood: Anxious  Affect: Congruent  Thought Process: Coherent  Orientation: Full (Time, Place, and Person)  Thought Content: Negative  Suicidal Thoughts: No  Homicidal Thoughts: No  Memory: Negative  Judgement: Fair  Insight: Present  Psychomotor Activity: Normal   Concentration: Concentration: Good  Recall: Good  Fund of Knowledge: Good  Language: Good  Akathisia: No  Handed: Right  AIMS (if indicated):   Assets: Resilience  ADL's: Intact  Cognition: WNL  Sleep: Number of Hours: 6.25   Treatment Plan Summary: Review of chart, vital signs, medications, and notes.  1-Individual and group therapy  2-Medication management for depression and anxiety: Medications reviewed with the patient.  3-Coping skills for depression, anxiety  4-Continue crisis stabilization and management  5-Address health issues--monitoring vital signs, stable  6-Treatment plan in progress to prevent relapse of depression and anxiety.  Lindwood QuaSheila May Kaily Wragg, NP [facility] 05/28/2016, 3:03 PM

## 2016-05-28 NOTE — Plan of Care (Signed)
Problem: Medication: Goal: Compliance with prescribed medication regimen will improve Outcome: Progressing Pt compliant with scheduled medication this shift

## 2016-05-29 NOTE — Progress Notes (Signed)
[facility] MD Progress Note  05/29/2016 9:57 AM Alejandro Conner  MRN:  [id number] Subjective:  Patient is room.  States that he is ok Objective:  Patient in bed.  Per nursing, patient stays to his room, limited participation in group.  States that his "kidney pain" still bothering him.  Otherwise, satisfied with Ibuprofen PRN.  Denies SI HI however, still feels depressed.  Flat affect.  Principal Problem: Methamphetamine use disorder, severe (HCC) Diagnosis:   Patient Active Problem List   Diagnosis Date Noted  . Methamphetamine use disorder, severe (HCC) [F15.20] 05/27/2016  . Hypokalemia [E87.6] 04/07/2016   Total Time spent with patient: 30 minutes  Past Psychiatric History:  See HPI  Past Medical History:  Past Medical History:  Diagnosis Date  . Bell palsy   . Kidney stones     Past Surgical History:  Procedure Laterality Date  . ANKLE RECONSTRUCTION     Family History: History reviewed. No pertinent family history. Family Psychiatric  History: see HPI Social History:  History  Alcohol Use No     History  Drug Use  . Types: Marijuana, Methamphetamines    Comment: unsure of how recently    Social History   Social History  . Marital status: Significant Other    Spouse name: N/A  . Number of children: N/A  . Years of education: N/A   Occupational History  . painter    Social History Main Topics  . Smoking status: Current Every Day Smoker    Packs/day: 1.00    Years: 30.00  . Smokeless tobacco: Never Used  . Alcohol use No  . Drug use:     Types: Marijuana, Methamphetamines     Comment: unsure of how recently  . Sexual activity: Not Asked   Other Topics Concern  . None   Social History Narrative  . None   Additional Social History:    Pain Medications: None Prescriptions: None Over the Counter: Says he uses flowmax  History of alcohol / drug use?: Yes Name of Substance 1: Marijuana 1 - Age of First Use: 44 years of age 44 - Amount (size/oz): Two joints  per day 1 - Frequency: Every day 1 - Duration: on-going 1 - Last Use / Amount: 05/24/16    Sleep: Good  Appetite:  Good  Current Medications: Current Facility-Administered Medications  Medication Dose Route Frequency Provider Last Rate Last Dose  . acetaminophen (TYLENOL) tablet 650 mg  650 mg Oral Q6H PRN Laveda Abbe, NP   650 mg at 05/27/16 4098  . alum & mag hydroxide-simeth (MAALOX/MYLANTA) 200-200-20 MG/5ML suspension 30 mL  30 mL Oral Q4H PRN Laveda Abbe, NP      . dextromethorphan-guaiFENesin [facility] DM) 30-600 MG per 12 hr tablet 1 tablet  1 tablet Oral BID Adonis Brook, NP   1 tablet at 05/29/16 0834  . hydrOXYzine (ATARAX/VISTARIL) tablet 25 mg  25 mg Oral Q6H PRN Laveda Abbe, NP   25 mg at 05/28/16 2124  . ibuprofen (ADVIL,MOTRIN) tablet 800 mg  800 mg Oral Q8H PRN Adonis Brook, NP   800 mg at 05/28/16 2124  . magnesium hydroxide (MILK OF MAGNESIA) suspension 30 mL  30 mL Oral Daily PRN Laveda Abbe, NP      . nicotine (NICODERM CQ - dosed in mg/24 hours) patch 21 mg  21 mg Transdermal Daily Jackelyn Poling, NP   21 mg at 05/29/16 0834  . tamsulosin (FLOMAX) capsule 0.4 mg  0.4 mg Oral  Daily Laveda AbbeLaurie Britton Parks, NP   0.4 mg at 05/29/16 16100834  . traZODone (DESYREL) tablet 50 mg  50 mg Oral QHS PRN Laveda AbbeLaurie Britton Parks, NP   50 mg at 05/28/16 2124    Lab Results:  Results for orders placed or performed during the hospital encounter of 05/26/16 (from the past 48 hour(s))  TSH     Status: None   Collection Time: 05/28/16  6:44 AM  Result Value Ref Range   TSH 0.393 0.350 - 4.500 uIU/mL    Comment: Performed by a 3rd Generation assay with a functional sensitivity of <=0.01 uIU/mL. Performed at [facility]   Vitamin B12     Status: None   Collection Time: 05/28/16  6:44 AM  Result Value Ref Range   Vitamin B-12 258 180 - 914 pg/mL    Comment: (NOTE) This assay is not validated for testing neonatal  or myeloproliferative syndrome specimens for Vitamin B12 levels. Performed at [facility]     Blood Alcohol level:  Lab Results  Component Value Date   [facility]5 05/25/2016   ETH 8 (H) 04/06/2016    Metabolic Disorder Labs: No results found for: HGBA1C, MPG No results found for: PROLACTIN No results found for: CHOL, TRIG, HDL, CHOLHDL, VLDL, LDLCALC  Physical Findings: AIMS: Facial and Oral Movements Muscles of Facial Expression: None, normal Lips and Perioral Area: None, normal Jaw: None, normal Tongue: None, normal,Extremity Movements Upper (arms, wrists, hands, fingers): None, normal Lower (legs, knees, ankles, toes): None, normal, Trunk Movements Neck, shoulders, hips: None, normal, Overall Severity Severity of abnormal movements (highest score from questions above): None, normal Incapacitation due to abnormal movements: None, normal Patient's awareness of abnormal movements (rate only patient's report): No Awareness, Dental Status Current problems with teeth and/or dentures?: No Does patient usually wear dentures?: No  CIWA:    COWS:     Musculoskeletal: Strength & Muscle Tone: within normal limits Gait & Station: normal Patient leans: N/A  Psychiatric Specialty Exam: Physical Exam  Nursing note and vitals reviewed.   Review of Systems  Constitutional: Negative.   HENT: Negative.   Eyes: Negative.   Respiratory: Negative.   Cardiovascular: Negative.   Gastrointestinal: Negative.   Genitourinary: Negative.   Skin: Negative.   Neurological: Negative.   Endo/Heme/Allergies: Negative.   Psychiatric/Behavioral: Positive for depression.    Blood pressure (!) 150/93, pulse 75, temperature 97.6 F (36.4 C), resp. rate 18, height 5\' 11"  (1.803 m), weight 86.6 kg (191 lb), SpO2 100 %.Body mass index is 26.64 kg/m.   General Appearance:Casual  Eye Contact: Good  Speech: Clear and Coherent  Volume: Normal  Mood: Anxious  Affect: Flat  Thought  Process: Coherent  Orientation: Full (Time, Place, and Person)  Thought Content: Negative  Suicidal Thoughts: No  Homicidal Thoughts: No  Memory: Negative  Judgement: Fair  Insight: Present  Psychomotor Activity: Normal  Concentration: Concentration: Good  Recall: Good  Fund of Knowledge: Good  Language: Good  Akathisia: No  Handed: Right  AIMS (if indicated):   Assets: Resilience  ADL's: Intact  Cognition: WNL  Sleep: Number of Hours: 6.25   Treatment Plan Summary: Review of chart, vital signs, medications, and notes.  1-Individual and group therapy  2-Medication management for depression and anxiety: Medications reviewed with the patient.  3-Coping skills for depression, anxiety  4-Continue crisis stabilization and management  5-Address health issues--monitoring vital signs, stable  6-Treatment plan in progress to prevent relapse of depression and anxiety.  Lindwood QuaSheila May Tiane Szydlowski, NP [facility] 05/29/2016, 9:57 AM

## 2016-05-29 NOTE — Progress Notes (Signed)
Patient did not attend the evening speaker AA meeting. Pt was notified that group was beginning but remained in bed.   

## 2016-05-29 NOTE — [facility] Group Notes (Signed)
[facility] Group Notes: (Clinical Social Work)   05/29/2016      Type of Therapy:  Group Therapy   Participation Level:  Did Not Attend despite MHT prompting   Yazlynn Birkeland Grossman-Orr, LCSW 05/29/2016, 12:31 PM     

## 2016-05-29 NOTE — Plan of Care (Signed)
Problem: Activity: Goal: Interest or engagement in activities will improve Outcome: Not Progressing Pt has not attended group on night shift this weekend Goal: Sleeping patterns will improve Outcome: Progressing Pt did report that he slept well last night

## 2016-05-29 NOTE — Progress Notes (Signed)
D.  Pt flat but pleasant on approach, denies complaints at this time.  Pt did not attend evening AA group, minimal interaction observed on unit.  Pt would like to be discharged tomorrow, is hopeful to speak to doctor about this tomorrow.  A.  Support and encouragement offered, medication given as ordered  R.  Pt remains safe on the unit, will continue to monitor.

## 2016-05-30 DIAGNOSIS — F17210 Nicotine dependence, cigarettes, uncomplicated: Secondary | ICD-10-CM

## 2016-05-30 DIAGNOSIS — E876 Hypokalemia: Secondary | ICD-10-CM

## 2016-05-30 MED ORDER — HYDROXYZINE HCL 25 MG PO TABS: 25.0000 mg | ORAL_TABLET | Freq: Four times a day (QID) | ORAL | 0 refills | Status: AC | PRN

## 2016-05-30 MED ORDER — TAMSULOSIN HCL 0.4 MG PO CAPS: 0.4000 mg | ORAL_CAPSULE | Freq: Every day | ORAL | Status: AC

## 2016-05-30 MED ORDER — NICOTINE 21 MG/24HR TD PT24: 21.0000 mg | MEDICATED_PATCH | Freq: Every day | TRANSDERMAL | 0 refills | Status: AC

## 2016-05-30 MED ORDER — TRAZODONE HCL 50 MG PO TABS: 50.0000 mg | ORAL_TABLET | Freq: Every evening | ORAL | 0 refills | Status: AC | PRN

## 2016-05-30 NOTE — [facility] Suicide Risk Assessment (Signed)
[facility] INPATIENT:  Family/Significant Other Suicide Prevention Education  Suicide Prevention Education:  Patient Refusal for Family/Significant Other Suicide Prevention Education: The patient Alejandro Conner has refused to provide written consent for family/significant other to be provided Family/Significant Other Suicide Prevention Education during admission and/or prior to discharge.  Physician notified.  SPE completed with pt, as pt refused to consent to family contact. SPI pamphlet provided to pt and pt was encouraged to share information with support network, ask questions, and talk about any concerns relating to SPE. Pt denies access to guns/firearms and verbalized understanding of information provided. Mobile Crisis information also provided to pt.   Shawnee Higham N Smart LCSW 05/30/2016, 10:47 AM

## 2016-05-30 NOTE — [facility] Suicide Risk Assessment (Addendum)
[facility]   Principal Problem: Methamphetamine use disorder, severe [facility](HCC) Discharge Diagnoses:  Patient Active Problem List   Diagnosis Date Noted  . Methamphetamine use disorder, severe (HCC) [F15.20] 05/27/2016  . Hypokalemia [E87.6] 04/07/2016    Total Time spent with patient: 30 minutes  Musculoskeletal: Strength & Muscle Tone: within normal limits Gait & Station: normal Patient leans: N/A  Psychiatric Specialty Exam: ROS denies headache, no chest pain, no shortness of breath, no nausea, no vomiting   Blood pressure 130/85, pulse 96, temperature 98.1 F (36.7 C), temperature source Oral, resp. rate 16, height 5\' 11"  (1.803 m), weight 191 lb (86.6 kg), SpO2 100 %.Body mass index is 26.64 kg/m.  General Appearance: Fairly Groomed  Patent attorneyye Contact::  Good  Speech:  Normal Rate409  Volume:  Normal  Mood:  improved, denies depression  Affect:  Appropriate  Thought Process:  Linear  Orientation:  Full (Time, Place, and Person)  Thought Content:  denies hallucinations, no delusions, not internally preoccupied   Suicidal Thoughts:  No denies any suicidal or self injurious ideations, denies any violent or homicidal ideations   Homicidal Thoughts:  No  Memory:  recent and remote grossly intact   Judgement:  Other:  improved   Insight:  improved   Psychomotor Activity:  Normal  Concentration:  Good  Recall:  Good  Fund of Knowledge:Good  Language: Good  Akathisia:  Negative  Handed:  Right  AIMS (if indicated):     Assets:  Desire for Improvement Resilience  Sleep:  Number of Hours: 6.25  Cognition: WNL  ADL's:  Intact   Mental Status Per Nursing Assessment::   On Admission:     Demographic Factors:  45 year old male, divorced,three children , who are with mother  Loss Factors: Legal issues, relapsed several months ago   Historical Factors: History of substance abuse - cocaine, more recently methamphetamine - history of depression- no  history of suicide attempts   Risk Reduction Factors:   Sense of responsibility to family and Positive coping skills or problem solving skills  Continued Clinical Symptoms:  At this time patient is alert, attentive,well related, calm, mood improved , denies depression at this time, affect appropriate, no thought disorder, no suicidal or self injurious ideations, no psychotic symptoms, behavior calm and in good control.  Cognitive Features That Contribute To Risk:  No gross cognitive deficits noted upon discharge. Is alert , attentive, and oriented x 3    Suicide Risk:  Mild:  Suicidal ideation of limited frequency, intensity, duration, and specificity.  There are no identifiable plans, no associated intent, mild dysphoria and related symptoms, good self-control (both objective and subjective assessment), few other risk factors, and identifiable protective factors, including available and accessible social support.  Follow-up Information    [facility] Follow up.   Contact information: [address] [place] [id number] [phone]           Plan Of Care/Follow-up recommendations:  Activity:  as tolerated  Diet:  Regular Tests:  NA Other:  See below  Patient is requesting discharge and there are no current grounds for involuntary commitment Patient is leaving unit in good spirits. Follow up as above. [place]hab [place] ( his father lives there ) next week. Has history of urolithiasis, [facility]    COBOS, Madaline GuthrieFERNANDO, MD 05/30/2016, 10:45 AM

## 2016-05-30 NOTE — Tx Team (Signed)
Interdisciplinary Treatment and Diagnostic Plan Update  05/30/2016 Time of Session: 0930 Alejandro Conner MRN: [id number]  Principal Diagnosis: Methamphetamine use disorder, severe ([place])  Secondary Diagnoses: Principal Problem:   Methamphetamine use disorder, severe ([place])   Current Medications:  Current Facility-Administered Medications  Medication Dose Route Frequency Provider Last Rate Last Dose  . acetaminophen (TYLENOL) tablet 650 mg  650 mg Oral Q6H PRN Ethelene Hal, NP   650 mg at 05/27/16 6948  . alum & mag hydroxide-simeth (MAALOX/MYLANTA) 200-200-20 MG/5ML suspension 30 mL  30 mL Oral Q4H PRN Ethelene Hal, NP      . dextromethorphan-guaiFENesin [facility] DM) 30-600 MG per 12 hr tablet 1 tablet  1 tablet Oral BID Kerrie Buffalo, NP   1 tablet at 05/30/16 0854  . hydrOXYzine (ATARAX/VISTARIL) tablet 25 mg  25 mg Oral Q6H PRN Ethelene Hal, NP   25 mg at 05/29/16 2118  . ibuprofen (ADVIL,MOTRIN) tablet 800 mg  800 mg Oral Q8H PRN Kerrie Buffalo, NP   800 mg at 05/29/16 2118  . magnesium hydroxide (MILK OF MAGNESIA) suspension 30 mL  30 mL Oral Daily PRN Ethelene Hal, NP      . nicotine (NICODERM CQ - dosed in mg/24 hours) patch 21 mg  21 mg Transdermal Daily Rozetta Nunnery, NP   21 mg at 05/30/16 0854  . tamsulosin (FLOMAX) capsule 0.4 mg  0.4 mg Oral Daily Ethelene Hal, NP   0.4 mg at 05/30/16 0854  . traZODone (DESYREL) tablet 50 mg  50 mg Oral QHS PRN Ethelene Hal, NP   50 mg at 05/29/16 2118   PTA Medications: Prescriptions Prior to Admission  Medication Sig Dispense Refill Last Dose  . nicotine (NICODERM CQ - DOSED IN MG/24 HOURS) 14 mg/24hr patch Place 1 patch (14 mg total) onto the skin daily. (Patient not taking: Reported on 05/26/2016) 28 patch 0 Not Taking at Unknown time  . pantoprazole (PROTONIX) 40 MG tablet Take 1 tablet (40 mg total) by mouth daily. (Patient not taking: Reported on 05/26/2016) 30 tablet 0 Not Taking at Unknown  time  . tamsulosin (FLOMAX) 0.4 MG CAPS capsule Take 0.4 mg by mouth daily.   05/25/2016 at Unknown time    Patient Stressors: Financial difficulties Substance abuse  Patient Strengths: Ability for insight Average or above average intelligence Capable of independent living Agricultural engineer for treatment/growth Supportive family/friends Work skills  Treatment Modalities: Medication Management, Group therapy, Case management,  1 to 1 session with clinician, Psychoeducation, Recreational therapy.   Physician Treatment Plan for Primary Diagnosis: Methamphetamine use disorder, severe ([place]) Long Term Goal(s): Improvement in symptoms so as ready for discharge Improvement in symptoms so as ready for discharge   Short Term Goals: Ability to demonstrate self-control will improve Ability to identify changes in lifestyle to reduce recurrence of condition will improve Ability to identify triggers associated with substance abuse/mental health issues will improve  Medication Management: Evaluate patient's response, side effects, and tolerance of medication regimen.  Therapeutic Interventions: 1 to 1 sessions, Unit Group sessions and Medication administration.  Evaluation of Outcomes: Met  Physician Treatment Plan for Secondary Diagnosis: Principal Problem:   Methamphetamine use disorder, severe ([place])  Long Term Goal(s): Improvement in symptoms so as ready for discharge Improvement in symptoms so as ready for discharge   Short Term Goals: Ability to demonstrate self-control will improve Ability to identify changes in lifestyle to reduce recurrence of condition will improve Ability to identify triggers associated with substance  abuse/mental health issues will improve     Medication Management: Evaluate patient's response, side effects, and tolerance of medication regimen.  Therapeutic Interventions: 1 to 1 sessions, Unit Group sessions and Medication  administration.  Evaluation of Outcomes: Met   RN Treatment Plan for Primary Diagnosis: Methamphetamine use disorder, severe ([place]) Long Term Goal(s): Knowledge of disease and therapeutic regimen to maintain health will improve  Short Term Goals: Ability to demonstrate self-control, Ability to identify and develop effective coping behaviors will improve and Compliance with prescribed medications will improve  Medication Management: RN will administer medications as ordered by provider, will assess and evaluate patient's response and provide education to patient for prescribed medication. RN will report any adverse and/or side effects to prescribing provider.  Therapeutic Interventions: 1 on 1 counseling sessions, Psychoeducation, Medication administration, Evaluate responses to treatment, Monitor vital signs and CBGs as ordered, Perform/monitor CIWA, COWS, AIMS and Fall Risk screenings as ordered, Perform wound care treatments as ordered.  Evaluation of Outcomes: Met   LCSW Treatment Plan for Primary Diagnosis: Methamphetamine use disorder, severe ([place]) Long Term Goal(s): Safe transition to appropriate next level of care at discharge, Engage patient in therapeutic group addressing interpersonal concerns.  Short Term Goals: Engage patient in aftercare planning with referrals and resources, Facilitate patient progression through stages of change regarding substance use diagnoses and concerns and Increase skills for wellness and recovery  Therapeutic Interventions: Assess for all discharge needs, 1 to 1 time with Social worker, Explore available resources and support systems, Assess for adequacy in community support network, Educate family and significant other(s) on suicide prevention, Complete Psychosocial Assessment, Interpersonal group therapy.  Evaluation of Outcomes: Met/Adequate for discharge    Progress in Treatment: Attending groups: yes Participating in groups: Yes Taking  medication as prescribed: Yes. Toleration medication: Yes. Family/Significant other contact made: SPE completed with pt; pt declined consent with family member.  Patient understands diagnosis: Yes. Discussing patient identified problems/goals with staff: Yes. Medical problems stabilized or resolved: Yes. Denies suicidal/homicidal ideation: Yes. Issues/concerns per patient self-inventory: Yes. Other: None  New problem(s) identified: No, Describe:  none reported  New Short Term/Long Term [place] l(s):no  Discharge Plan or Barriers: Pt interested in attending residential treatment in [place]--his father is working on this. Pt agreeable to outpatient appt at [facility] and will be picked up after lunch on Monday 05/30/2016.   Reason for Continuation of Hospitalization: none  Estimated Length of Stay: D/c today   Attendees: Patient: 05/30/2016 10:48 AM  Physician: Dr Sharolyn Douglas 05/30/2016 10:48 AM  Nursing: Alease Frame RN 05/30/2016 10:48 AM  RN Care Manager: Lars Pinks CM 05/30/2016 10:48 AM  Social Worker: Maxie Better, LCSW 05/30/2016 10:48 AM  Recreational Therapist:  05/30/2016 10:48 AM  Other: Lindell Spar, NP, Ricky Ala NP 05/30/2016 10:48 AM  Other:  05/30/2016 10:48 AM  Other: 05/30/2016 10:48 AM    Scribe for Treatment Team: [place], LCSW 05/30/2016 10:48 AM

## 2016-05-30 NOTE — Progress Notes (Signed)
Recreation Therapy Notes  Date: 05/30/16 Time: 0930 Location: 300 Dayroom  Group Topic: Stress Management  Goal Area(s) Addresses:  Patient will verbalize importance of using healthy stress management.  Patient will identify positive emotions associated with healthy stress management.   Intervention: Calm App  Activity :  Body Scan Meditation.  LRT introduced the stress management technique of meditation.  LRT played a body scan meditation from the Calm app to allow patients to participate in the technique.  Patients were to follow along to fully engage in the technique.  Education:  Stress Management, Discharge Planning.   Education Outcome: Acknowledges edcuation/In group clarification offered/Needs additional education  Clinical Observations/Feedback: Pt did not attend group.   Caroll RancherMarjette Maeleigh Buschman, LRT/CTRS        Caroll RancherLindsay, Shalyn Koral A 05/30/2016 11:24 AM

## 2016-05-30 NOTE — Discharge Summary (Signed)
Physician Discharge Summary Note  Patient:  Alejandro Conner is an 45 y.o., male MRN:  [id number] DOB:  12/16/1970 Patient phone:  [phone] (home)  Patient address:   [address] [place] [address],  Total Time spent with patient: Greater than 30 minutes  Date of Admission:  05/26/2016  Date of Discharge: 05-30-16  Reason for Admission: Drug intoxications/withdrawal symptoms.  Principal Problem: Methamphetamine use disorder, severe [facility])  Discharge Diagnoses: Patient Active Problem List   Diagnosis Date Noted  . Methamphetamine use disorder, severe (HCC) [F15.20] 05/27/2016  . Hypokalemia [E87.6] 04/07/2016   Past Psychiatric History: Methamphetamine dependence  Past Medical History:  Past Medical History:  Diagnosis Date  . Bell palsy   . Kidney stones     Past Surgical History:  Procedure Laterality Date  . ANKLE RECONSTRUCTION     Family History: History reviewed. No pertinent family history.  Family Psychiatric  History: See H&P  Social History:  History  Alcohol Use No     History  Drug Use  . Types: Marijuana, Methamphetamines    Comment: unsure of how recently    Social History   Social History  . Marital status: Significant Other    Spouse name: N/A  . Number of children: N/A  . Years of education: N/A   Occupational History  . painter    Social History Main Topics  . Smoking status: Current Every Day Smoker    Packs/day: 1.00    Years: 30.00  . Smokeless tobacco: Never Used  . Alcohol use No  . Drug use:     Types: Marijuana, Methamphetamines     Comment: unsure of how recently  . Sexual activity: Not Asked   Other Topics Concern  . None   Social History Narrative  . None   Hospital Course: Patient presented to the emergency room with what he describes as an "fake" suicide attempt with 3 or 4 antibiotic pills because he needed help to detox from methamphetamine and get treatment for his methamphetamine use disorder. Patient  states that he started using methamphetamine about 16 months ago and typically uses every weekend all weekend about $50 worth. He states that he eats the methamphetamine and does not use IV drugs. He reports that he was  "crack head" in the past but has been clean from cocaine for about 8 years. He occasionally smokes marijuana but denies alcohol use or other drug use. DSS on admission was positive for marijuana and amphetamines. Alcohol was negative. Patient reports inability to cut down, craving, using more than in tended and used despite consequences. He reports that he is in danger of losing his job because he often misses work on Monday secondary to being hung over from his methamphetamine binge.  Although presented to the ED with reports of Amphetamine & THC addiction, requesting detoxification treatments, Navdeep was not presenting with any substance withdrawal symptoms. However, after evaluation of his symptoms, he was medicated & discharged on Hydroxyzine 25 mg prn for anxiety, Nicotine patch 21 mg for smoking cessation & Trazodone 50 mg for insomnia. He was enrolled & participated in the group counseling sessions being offered & held on this unit. He learned coping skills.  Besides the treatment for insomnia & anxiety symptoms, [name] also was resumed on his pertinent home medication for his BPH issues. He tolerated his treatment regimen without any adverse effects or reactions. Shae's symptoms responded well to his treatment regimen. This is evidenced by his reports of improved mood, anxiety symptoms  absence of substance withdrawal symptoms. He is currently being discharged to follow-up care as noted below. He is provided with all the pertinent information needed to make this appointment without problems.  Upon discharge, [name] adamantly denies any SIHI, AVH, delusional thoughts or paranoia. He was provided with a 7 days worth supply samples of his [facility] discharge medications. He left [facility] in no apparent  distress with all belongings. Transportation per father.  Physical Findings: AIMS: Facial and Oral Movements Muscles of Facial Expression: None, normal Lips and Perioral Area: None, normal Jaw: None, normal Tongue: None, normal,Extremity Movements Upper (arms, wrists, hands, fingers): None, normal Lower (legs, knees, ankles, toes): None, normal, Trunk Movements Neck, shoulders, hips: None, normal, Overall Severity Severity of abnormal movements (highest score from questions above): None, normal Incapacitation due to abnormal movements: None, normal Patient's awareness of abnormal movements (rate only patient's report): No Awareness, Dental Status Current problems with teeth and/or dentures?: No Does patient usually wear dentures?: No  CIWA:    COWS:     Musculoskeletal: Strength & Muscle Tone: within normal limits Gait & Station: normal Patient leans: N/A  Psychiatric Specialty Exam: Physical Exam  Constitutional: He is oriented to person, place, and time. He appears well-developed.  HENT:  Head: Normocephalic.  Eyes: Pupils are equal, round, and reactive to light.  Neck: Normal range of motion.  Cardiovascular: Normal rate.   Respiratory: Effort normal.  GI: Soft.  Genitourinary:  Genitourinary Comments: Denies any issues in this area  Musculoskeletal: Normal range of motion.  Neurological: He is alert and oriented to person, place, and time.  Skin: Skin is warm and dry.    Review of Systems  Constitutional: Negative.   HENT: Negative.   Eyes: Negative.   Respiratory: Negative.   Cardiovascular: Negative.   Gastrointestinal: Negative.   Genitourinary: Negative.   Musculoskeletal: Negative.   Skin: Negative.   Neurological: Negative.   Endo/Heme/Allergies: Negative.   Psychiatric/Behavioral: Positive for depression (Stable) and substance abuse (Hx. Metamphetamine dependence.). Negative for hallucinations, memory loss and suicidal ideas. The patient has insomnia  (Stable). The patient is not nervous/anxious.     Blood pressure 130/85, pulse 96, temperature 98.1 F (36.7 C), temperature source Oral, resp. rate 16, height 5\' 11"  (1.803 m), weight 86.6 kg (191 lb), SpO2 100 %.Body mass index is 26.64 kg/m.  See Md's SRA   Have you used any form of tobacco in the last 30 days? (Cigarettes, Smokeless Tobacco, Cigars, and/or Pipes): Yes  Has this patient used any form of tobacco in the last 30 days? (Cigarettes, Smokeless Tobacco, Cigars, and/or Pipes):Yes, provided with nicotine patch 21 mg prescription.  Blood Alcohol level:  Lab Results  Component Value Date   ETH <5 05/25/2016   ETH 8 (H) 04/06/2016   Metabolic Disorder Labs:  No results found for: HGBA1C, MPG No results found for: PROLACTIN No results found for: CHOL, TRIG, HDL, CHOLHDL, VLDL, LDLCALC  See Psychiatric Specialty Exam and Suicide Risk Assessment completed by Attending Physician prior to discharge.  Discharge destination:  Home  Is patient on multiple antipsychotic therapies at discharge:  No   Has Patient had three or more failed trials of antipsychotic monotherapy by history:  No  Recommended Plan for Multiple Antipsychotic Therapies: NA  Allergies as of 05/30/2016      Reactions   Morphine And Related    Per significant other(Unknown)      Medication List    STOP taking these medications   nicotine 14 mg/24hr patch Commonly  known as:  NICODERM CQ - dosed in mg/24 hours Replaced by:  nicotine 21 mg/24hr patch   pantoprazole 40 MG tablet Commonly known as:  PROTONIX     TAKE these medications     Indication  hydrOXYzine 25 MG tablet Commonly known as:  ATARAX/VISTARIL Take 1 tablet (25 mg total) by mouth every 6 (six) hours as needed for anxiety.  Indication:  Anxiety Neurosis   nicotine 21 mg/24hr patch Commonly known as:  NICODERM CQ - dosed in mg/24 hours Place 1 patch (21 mg total) onto the skin daily. For smoking cessation Start taking on:   05/31/2016 Replaces:  nicotine 14 mg/24hr patch  Indication:  Nicotine Addiction   tamsulosin 0.4 MG Caps capsule Commonly known as:  FLOMAX Take 1 capsule (0.4 mg total) by mouth daily. For Prostate health What changed:  additional instructions  Indication:  Benign Enlargement of Prostate   traZODone 50 MG tablet Commonly known as:  DESYREL Take 1 tablet (50 mg total) by mouth at bedtime as needed for sleep.  Indication:  Trouble Sleeping      Follow-up Information    [facility] Follow up on 06/01/2016.   Why:  Walk in between 8am-10am on this date for hospital follow-up. Please bring: Social Security card, Photo ID, and any proof of income if you have any. Thank you.  Contact information: [address] [place] [id number] [phone]         Follow-up recommendations: Activity:  As tolerated Diet: As recommended by your primary care doctor. Keep all scheduled follow-up appointments as recommended.   Comments: Patient is instructed prior to discharge to: Take all medications as prescribed by his/her mental healthcare provider. Report any adverse effects and or reactions from the medicines to his/her outpatient provider promptly. Patient has been instructed & cautioned: To not engage in alcohol and or illegal drug use while on prescription medicines. In the event of worsening symptoms, patient is instructed to call the crisis hotline, 911 and or go to the nearest ED for appropriate evaluation and treatment of symptoms. To follow-up with his/her primary care provider for your other medical issues, concerns and or health care needs.   Signed: Sanjuana KavaNwoko, Agnes I, NP, PMHNP, FNP-BC 05/30/2016, 10:50 AM   Patient seen, Suicide Assessment Completed.  Disposition Plan Reviewed

## 2016-05-30 NOTE — Progress Notes (Signed)
  [facility] Discharge Plan :  Will you be returning to the same living situation after discharge:  Yes,  returning home At discharge, do you have transportation home?: Yes,  pt's father Do you have the ability to pay for your medications: Yes,  mental health  Release of information consent forms completed and submitted to medical records by CSW.  Patient to Follow up at: Follow-up Information    [facility] Follow up on 06/01/2016.   Why:  Walk in between 8am-10am on this date for hospital follow-up. Please bring: Social Security card, Photo ID, and any proof of income if you have any. Thank you.  Contact information: [address] [place] [id number] [phone]           Next level of care provider has access to [facility] Link:no  Safety Planning and Suicide Prevention discussed: Yes,  SPE completed with pt; pt declined to consent to family contact. SPI pamphlet and Mobile Crisis information provided to pt; he was encouraged to share information with support network.  Have you used any form of tobacco in the last 30 days? (Cigarettes, Smokeless Tobacco, Cigars, and/or Pipes): Yes  Has patient been referred to the Quitline?: Patient refused referral  Patient has been referred for addiction treatment: Yes  Cordelia Bessinger N Smart LCSW 05/30/2016, 10:47 AM

## 2018-06-08 IMAGING — CT CT ABD-PELV W/O CM
2 of 4 series · 16 of 46 positions shown, 18 images · non-contrast
Comparison: Renal ultrasound April 09, 2016

CLINICAL DATA: Hematuria, bilateral flank pain. Difficulty
urinating, incontinent. Diarrhea. History of kidney stones.

EXAM:
CT ABDOMEN AND PELVIS WITHOUT CONTRAST
TECHNIQUE: Multidetector CT imaging of the abdomen and pelvis was performed
following the standard protocol without IV contrast.

[Series 2: axial st · axial · 0.76mm/px · z∈[+965,+1335]mm · 13 of 84 slices shown, 15 images]
[im 5/84  soft-tissue]
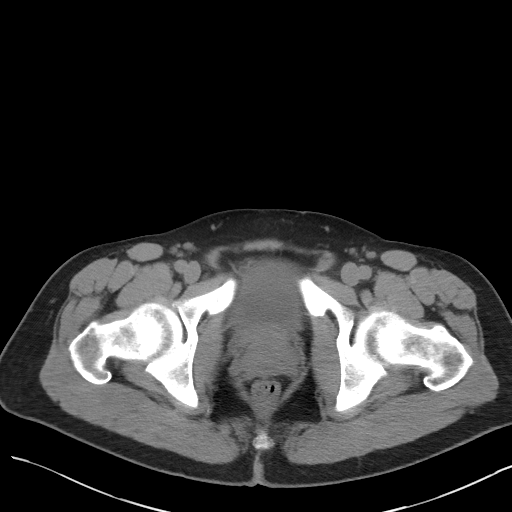
[im 5/84  bone]
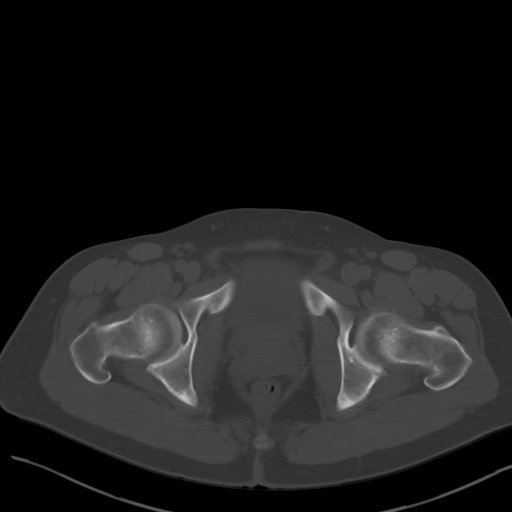
[im 14/84  soft-tissue]
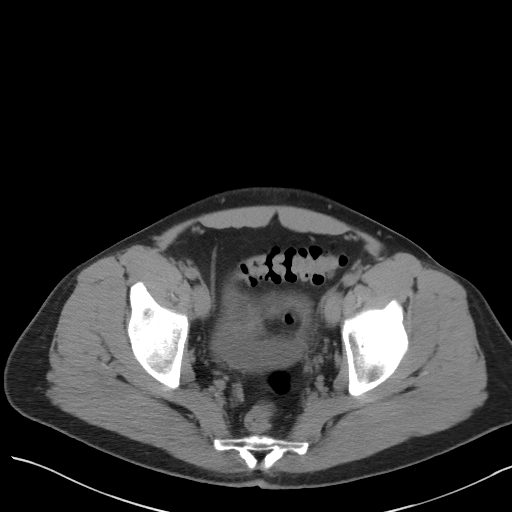
[im 18/84  soft-tissue]
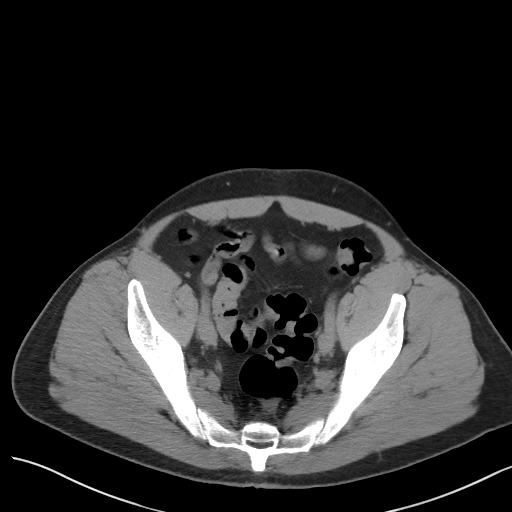
[im 22/84  soft-tissue]
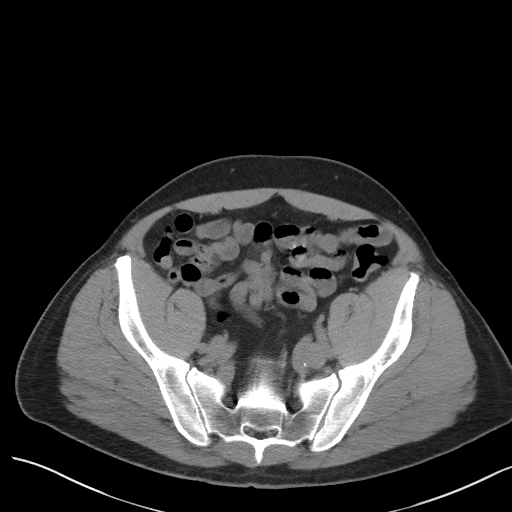
[im 31/84  soft-tissue]
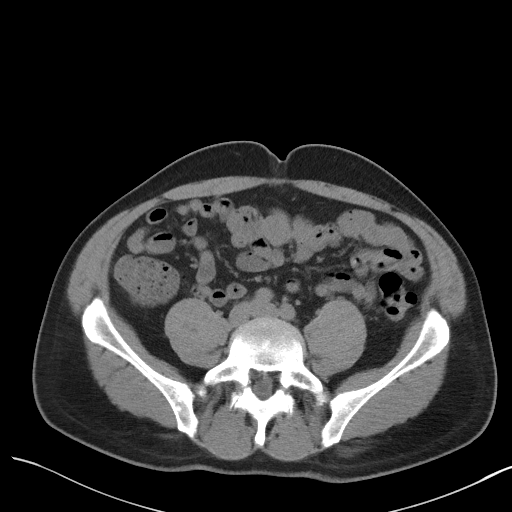
[im 35/84  soft-tissue]
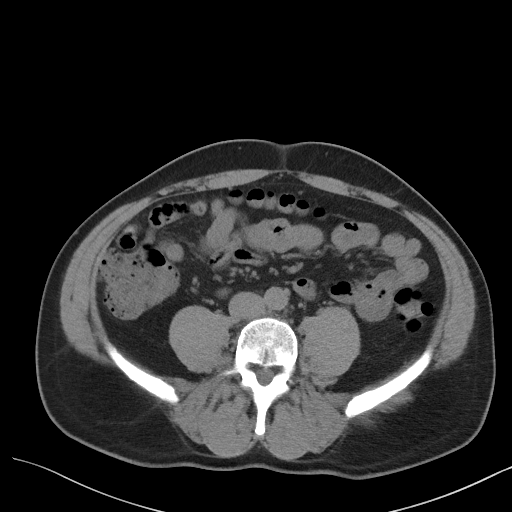
[im 44/84  soft-tissue]
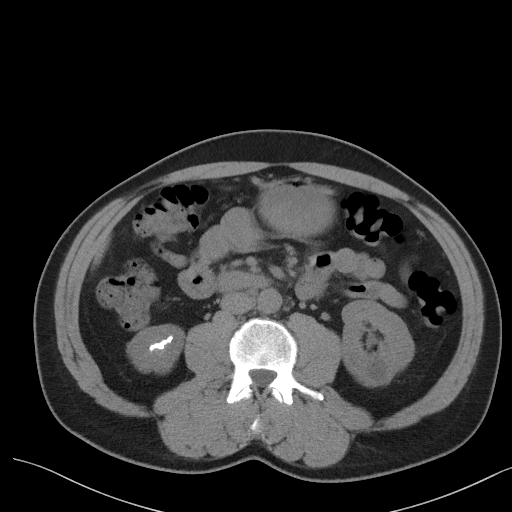
[im 49/84  soft-tissue]
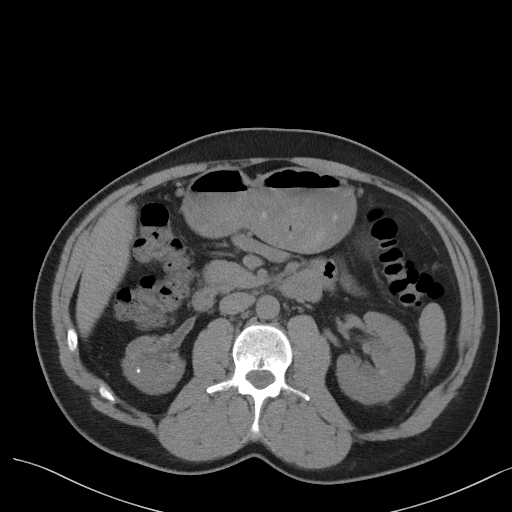
[im 53/84  soft-tissue]
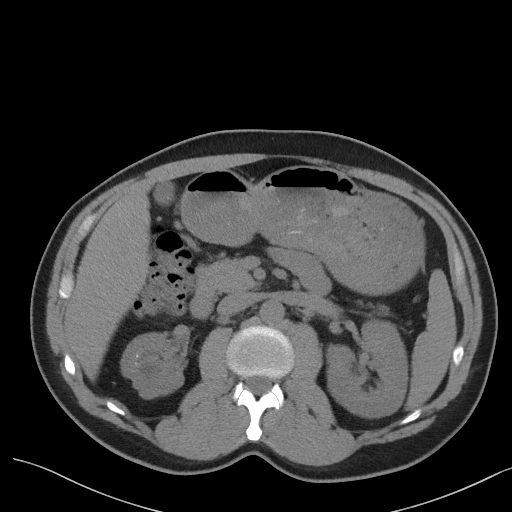
[im 53/84  bone]
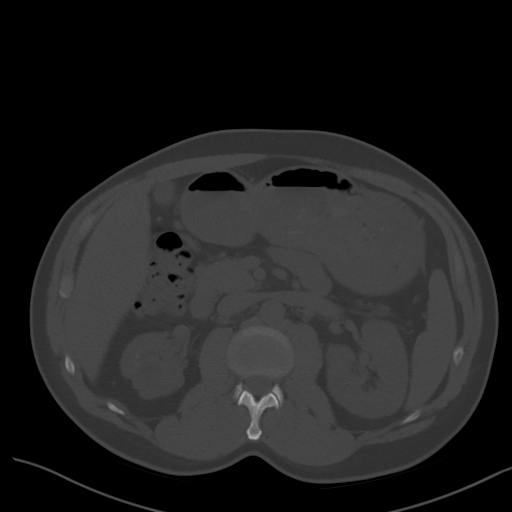
[im 62/84  soft-tissue]
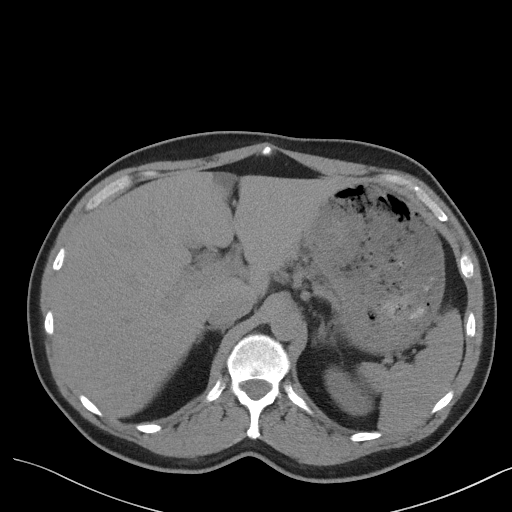
[im 66/84  soft-tissue]
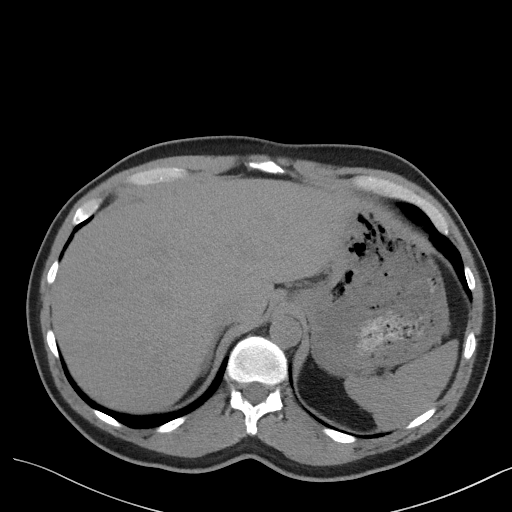
[im 70/84  soft-tissue]
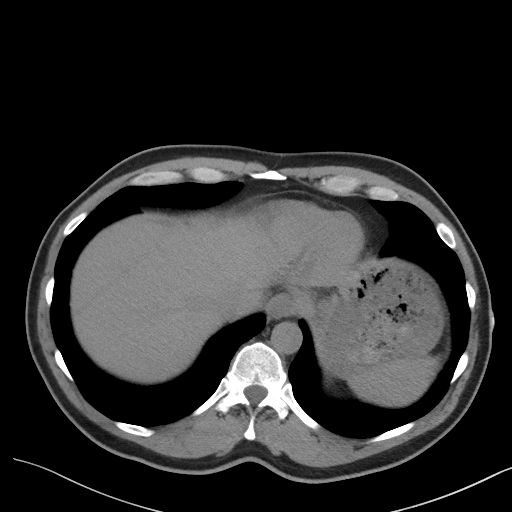
[im 79/84  soft-tissue]
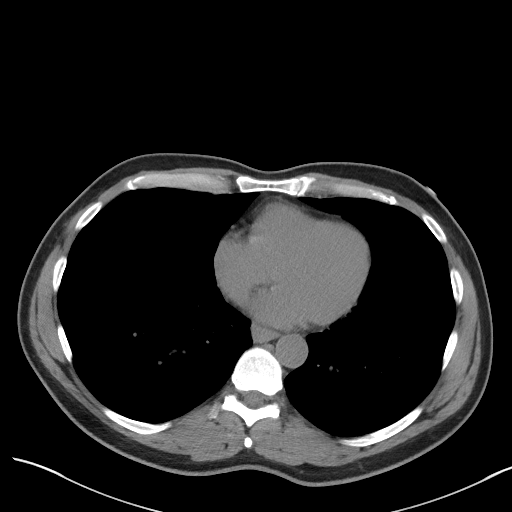

[Series 3: coronal st · coronal · 0.77mm/px · 3 of 101 slices shown]
[im 34/101  soft-tissue]
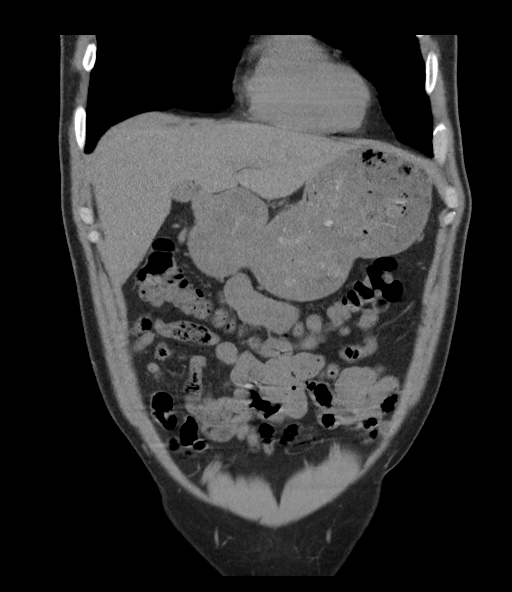
[im 45/101  soft-tissue]
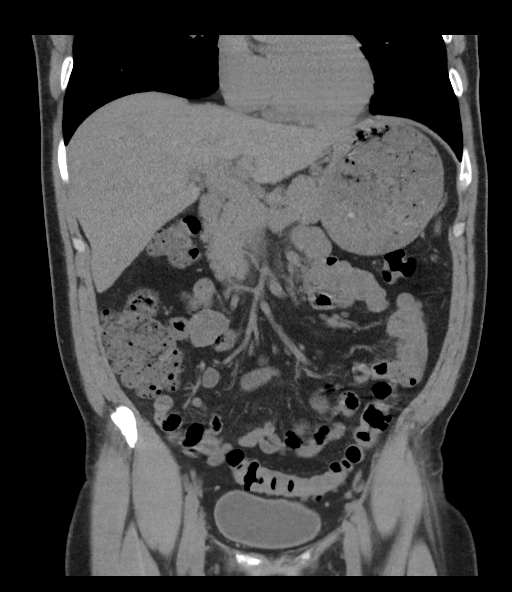
[im 56/101  soft-tissue]
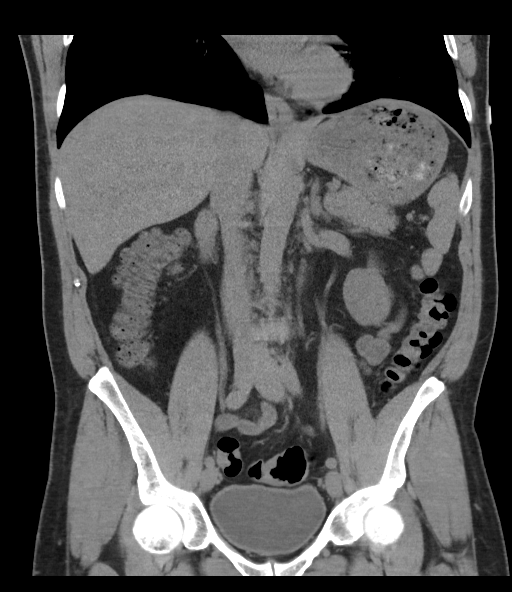

[16 of 46 positions shown; findings below may reference images not displayed]

FINDINGS: LOWER CHEST: Lung bases are clear. The visualized heart size is
normal. No pericardial effusion.

HEPATOBILIARY: Focal fatty infiltration about the falciform
ligament, liver is otherwise unremarkable.

PANCREAS: Normal.

SPLEEN: Normal.

ADRENALS/URINARY TRACT: The kidneys are orthotopic. Atrophic, severe
cortical scarring of RIGHT kidney with far greater than 10 renal
calculi measuring up the 9 mm. 16 mm LEFT interpolar cyst. Minimal
scarring LEFT kidney. Punctate LEFT nephrolithiasis. Limited
assessment for renal masses by noncontrast CT. No hydronephrosis.
Urinary bladder is well distended, harboring no intravesicular
calculi.

STOMACH/BOWEL: The stomach, small and large bowel are normal in
course and caliber without inflammatory changes, sensitivity
decreased by lack of enteric contrast. Normal appendix.

VASCULAR/LYMPHATIC: Aortoiliac vessels are normal in course and
caliber, trace calcific atherosclerosis. No lymphadenopathy by CT
size criteria.

REPRODUCTIVE: Prostate deforms the base of the urinary bladder
without frank invasion. Dystrophic prostatic calcifications.
Slightly calcified RIGHT seminal vesicles associated with diabetes.

OTHER: No intraperitoneal free fluid or free air.

MUSCULOSKELETAL: Non-acute. Small fat containing umbilical hernia.
Grade 1 L5-S1 anterolisthesis on the basis of chronic bilateral pars
interarticularis defects resulting in moderate to severe L5-S1
neural foraminal narrowing.
IMPRESSION: Atrophic scarred RIGHT kidney with numerous nonobstructing RIGHT
nephrolithiasis. Punctate nonobstructing LEFT nephrolithiasis.

No acute intra- abdominal or pelvic process.

Grade 1 L5-S1 anterolisthesis, chronic bilateral L5 pars
interarticularis defects.
